# Patient Record
Sex: Female | Born: 1985
Health system: Southern US, Community
[De-identification: ages and names within clinical notes are randomized; demographics above are authoritative.]

## PROBLEM LIST (undated history)

## (undated) ENCOUNTER — Emergency Department (HOSPITAL_COMMUNITY): Admission: EM | Source: Home / Self Care

## (undated) DIAGNOSIS — F419 Anxiety disorder, unspecified: Secondary | ICD-10-CM

## (undated) DIAGNOSIS — F32A Depression, unspecified: Secondary | ICD-10-CM

## (undated) DIAGNOSIS — F329 Major depressive disorder, single episode, unspecified: Secondary | ICD-10-CM

## (undated) DIAGNOSIS — L4050 Arthropathic psoriasis, unspecified: Secondary | ICD-10-CM

## (undated) HISTORY — DX: Major depressive disorder, single episode, unspecified: F32.9

## (undated) HISTORY — DX: Anxiety disorder, unspecified: F41.9

## (undated) HISTORY — DX: Arthropathic psoriasis, unspecified: L40.50

## (undated) HISTORY — DX: Depression, unspecified: F32.A

---

## 2018-08-26 ENCOUNTER — Ambulatory Visit (INDEPENDENT_AMBULATORY_CARE_PROVIDER_SITE_OTHER): Payer: BLUE CROSS/BLUE SHIELD | Admitting: Urgent Care

## 2018-08-26 ENCOUNTER — Encounter: Payer: Self-pay | Admitting: Urgent Care

## 2018-08-26 VITALS — BP 118/70 | HR 75 | Temp 98.0°F | Resp 16 | Ht 61.0 in | Wt 152.2 lb

## 2018-08-26 DIAGNOSIS — Z13228 Encounter for screening for other metabolic disorders: Secondary | ICD-10-CM | POA: Diagnosis not present

## 2018-08-26 DIAGNOSIS — R21 Rash and other nonspecific skin eruption: Secondary | ICD-10-CM

## 2018-08-26 DIAGNOSIS — Z0000 Encounter for general adult medical examination without abnormal findings: Secondary | ICD-10-CM

## 2018-08-26 DIAGNOSIS — Z111 Encounter for screening for respiratory tuberculosis: Secondary | ICD-10-CM

## 2018-08-26 DIAGNOSIS — L4050 Arthropathic psoriasis, unspecified: Secondary | ICD-10-CM

## 2018-08-26 DIAGNOSIS — Z124 Encounter for screening for malignant neoplasm of cervix: Secondary | ICD-10-CM

## 2018-08-26 DIAGNOSIS — Z1329 Encounter for screening for other suspected endocrine disorder: Secondary | ICD-10-CM | POA: Diagnosis not present

## 2018-08-26 DIAGNOSIS — Z130 Encounter for screening for diseases of the blood and blood-forming organs and certain disorders involving the immune mechanism: Secondary | ICD-10-CM

## 2018-08-26 DIAGNOSIS — B354 Tinea corporis: Secondary | ICD-10-CM

## 2018-08-26 DIAGNOSIS — Z23 Encounter for immunization: Secondary | ICD-10-CM | POA: Diagnosis not present

## 2018-08-26 DIAGNOSIS — Z7189 Other specified counseling: Secondary | ICD-10-CM

## 2018-08-26 DIAGNOSIS — N761 Subacute and chronic vaginitis: Secondary | ICD-10-CM

## 2018-08-26 DIAGNOSIS — Z7185 Encounter for immunization safety counseling: Secondary | ICD-10-CM

## 2018-08-26 MED ORDER — FLUCONAZOLE 150 MG PO TABS: 150.0000 mg | ORAL_TABLET | ORAL | 0 refills | Status: DC

## 2018-08-26 MED ORDER — ALPRAZOLAM 0.5 MG PO TABS: 0.5000 mg | ORAL_TABLET | Freq: Every evening | ORAL | 0 refills | Status: DC | PRN

## 2018-08-26 MED ORDER — SULFASALAZINE 500 MG PO TABS: 500.0000 mg | ORAL_TABLET | Freq: Every day | ORAL | 3 refills | Status: DC

## 2018-08-26 MED ORDER — SERTRALINE HCL 50 MG PO TABS: 50.0000 mg | ORAL_TABLET | Freq: Every day | ORAL | 1 refills | Status: DC

## 2018-08-26 NOTE — Patient Instructions (Addendum)
[facility] [place], [place] [address]  Burnard Leigh [phone]  Horton Finer [phone]  Everardo Beals [phone]   [facility] Address: [address] [place], [place] [address]  [phone] I'll be starting in October 2019.    Health Maintenance, Female Adopting a healthy lifestyle and getting preventive care can go a long way to promote health and wellness. Talk with your health care provider about what schedule of regular examinations is right for you. This is a good chance for you to check in with your provider about disease prevention and staying healthy. In between checkups, there are plenty of things you can do on your own. Experts have done a lot of research about which lifestyle changes and preventive measures are most likely to keep you healthy. Ask your health care provider for more information. Weight and diet Eat a healthy diet  Be sure to include plenty of vegetables, fruits, low-fat dairy products, and lean protein.  Do not eat a lot of foods high in solid fats, added sugars, or salt.  Get regular exercise. This is one of the most important things you can do for your health. ? Most adults should exercise for at least 150 minutes each week. The exercise should increase your heart rate and make you sweat (moderate-intensity exercise). ? Most adults should also do strengthening exercises at least twice a week. This is in addition to the moderate-intensity exercise.  Maintain a healthy weight  Body mass index (BMI) is a measurement that can be used to identify possible weight problems. It estimates body fat based on height and weight. Your health care provider can help determine your BMI and help you achieve or maintain a healthy weight.  For females 81 years of age and older: ? A BMI below 18.5 is considered underweight. ? A BMI of 18.5 to 24.9 is normal. ? A BMI of 25 to 29.9 is  considered overweight. ? A BMI of 30 and above is considered obese.  Watch levels of cholesterol and blood lipids  You should start having your blood tested for lipids and cholesterol at 33 years of age, then have this test every 5 years.  You may need to have your cholesterol levels checked more often if: ? Your lipid or cholesterol levels are high. ? You are older than 33 years of age. ? You are at high risk for heart disease.  Cancer screening Lung Cancer  Lung cancer screening is recommended for adults 82-95 years old who are at high risk for lung cancer because of a history of smoking.  A yearly low-dose CT scan of the lungs is recommended for people who: ? Currently smoke. ? Have quit within the past 15 years. ? Have at least a 30-pack-year history of smoking. A pack year is smoking an average of one pack of cigarettes a day for 1 year.  Yearly screening should continue until it has been 15 years since you quit.  Yearly screening should stop if you develop a health problem that would prevent you from having lung cancer treatment.  Breast Cancer  Practice breast self-awareness. This means understanding how your breasts normally appear and feel.  It also means doing regular breast self-exams. Let your health care provider know about any changes, no matter how small.  If you are in your 20s or 30s, you should have a clinical breast exam (CBE) by a health care provider every 1-3 years as part of a regular  health exam.  If you are 40 or older, have a CBE every year. Also consider having a breast X-ray (mammogram) every year.  If you have a family history of breast cancer, talk to your health care provider about genetic screening.  If you are at high risk for breast cancer, talk to your health care provider about having an MRI and a mammogram every year.  Breast cancer gene (BRCA) assessment is recommended for women who have family members with BRCA-related cancers.  BRCA-related cancers include: ? Breast. ? Ovarian. ? Tubal. ? Peritoneal cancers.  Results of the assessment will determine the need for genetic counseling and BRCA1 and BRCA2 testing.  Cervical Cancer Your health care provider may recommend that you be screened regularly for cancer of the pelvic organs (ovaries, uterus, and vagina). This screening involves a pelvic examination, including checking for microscopic changes to the surface of your cervix (Pap test). You may be encouraged to have this screening done every 3 years, beginning at age 82.  For women ages 106-65, health care providers may recommend pelvic exams and Pap testing every 3 years, or they may recommend the Pap and pelvic exam, combined with testing for human papilloma virus (HPV), every 5 years. Some types of HPV increase your risk of cervical cancer. Testing for HPV may also be done on women of any age with unclear Pap test results.  Other health care providers may not recommend any screening for nonpregnant women who are considered low risk for pelvic cancer and who do not have symptoms. Ask your health care provider if a screening pelvic exam is right for you.  If you have had past treatment for cervical cancer or a condition that could lead to cancer, you need Pap tests and screening for cancer for at least 20 years after your treatment. If Pap tests have been discontinued, your risk factors (such as having a new sexual partner) need to be reassessed to determine if screening should resume. Some women have medical problems that increase the chance of getting cervical cancer. In these cases, your health care provider may recommend more frequent screening and Pap tests.  Colorectal Cancer  This type of cancer can be detected and often prevented.  Routine colorectal cancer screening usually begins at 33 years of age and continues through 33 years of age.  Your health care provider may recommend screening at an earlier age if  you have risk factors for colon cancer.  Your health care provider may also recommend using home test kits to check for hidden blood in the stool.  A small camera at the end of a tube can be used to examine your colon directly (sigmoidoscopy or colonoscopy). This is done to check for the earliest forms of colorectal cancer.  Routine screening usually begins at age 8.  Direct examination of the colon should be repeated every 5-10 years through 33 years of age. However, you may need to be screened more often if early forms of precancerous polyps or small growths are found.  Skin Cancer  Check your skin from head to toe regularly.  Tell your health care provider about any new moles or changes in moles, especially if there is a change in a mole's shape or color.  Also tell your health care provider if you have a mole that is larger than the size of a pencil eraser.  Always use sunscreen. Apply sunscreen liberally and repeatedly throughout the day.  Protect yourself by wearing long sleeves, pants, a  wide-brimmed hat, and sunglasses whenever you are outside.  Heart disease, diabetes, and high blood pressure  High blood pressure causes heart disease and increases the risk of stroke. High blood pressure is more likely to develop in: ? People who have blood pressure in the high end of the normal range (130-139/85-89 mm Hg). ? People who are overweight or obese. ? People who are African American.  If you are 59-106 years of age, have your blood pressure checked every 3-5 years. If you are 59 years of age or older, have your blood pressure checked every year. You should have your blood pressure measured twice-once when you are at a hospital or clinic, and once when you are not at a hospital or clinic. Record the average of the two measurements. To check your blood pressure when you are not at a hospital or clinic, you can use: ? An automated blood pressure machine at a pharmacy. ? A home blood  pressure monitor.  If you are between 67 years and 70 years old, ask your health care provider if you should take aspirin to prevent strokes.  Have regular diabetes screenings. This involves taking a blood sample to check your fasting blood sugar level. ? If you are at a normal weight and have a low risk for diabetes, have this test once every three years after 33 years of age. ? If you are overweight and have a high risk for diabetes, consider being tested at a younger age or more often. Preventing infection Hepatitis B  If you have a higher risk for hepatitis B, you should be screened for this virus. You are considered at high risk for hepatitis B if: ? You were born in a country where hepatitis B is common. Ask your health care provider which countries are considered high risk. ? Your parents were born in a high-risk country, and you have not been immunized against hepatitis B (hepatitis B vaccine). ? You have HIV or AIDS. ? You use needles to inject street drugs. ? You live with someone who has hepatitis B. ? You have had sex with someone who has hepatitis B. ? You get hemodialysis treatment. ? You take certain medicines for conditions, including cancer, organ transplantation, and autoimmune conditions.  Hepatitis C  Blood testing is recommended for: ? Everyone born from [birth year] through 1966. ? Anyone with known risk factors for hepatitis C.  Sexually transmitted infections (STIs)  You should be screened for sexually transmitted infections (STIs) including gonorrhea and chlamydia if: ? You are sexually active and are younger than 33 years of age. ? You are older than 33 years of age and your health care provider tells you that you are at risk for this type of infection. ? Your sexual activity has changed since you were last screened and you are at an increased risk for chlamydia or gonorrhea. Ask your health care provider if you are at risk.  If you do not have HIV, but are at risk,  it may be recommended that you take a prescription medicine daily to prevent HIV infection. This is called pre-exposure prophylaxis (PrEP). You are considered at risk if: ? You are sexually active and do not regularly use condoms or know the HIV status of your partner(s). ? You take drugs by injection. ? You are sexually active with a partner who has HIV.  Talk with your health care provider about whether you are at high risk of being infected with HIV. If you choose to  begin PrEP, you should first be tested for HIV. You should then be tested every 3 months for as long as you are taking PrEP. Pregnancy  If you are premenopausal and you may become pregnant, ask your health care provider about preconception counseling.  If you may become pregnant, take 400 to 800 micrograms (mcg) of folic acid every day.  If you want to prevent pregnancy, talk to your health care provider about birth control (contraception). Osteoporosis and menopause  Osteoporosis is a disease in which the bones lose minerals and strength with aging. This can result in serious bone fractures. Your risk for osteoporosis can be identified using a bone density scan.  If you are 31 years of age or older, or if you are at risk for osteoporosis and fractures, ask your health care provider if you should be screened.  Ask your health care provider whether you should take a calcium or vitamin D supplement to lower your risk for osteoporosis.  Menopause may have certain physical symptoms and risks.  Hormone replacement therapy may reduce some of these symptoms and risks. Talk to your health care provider about whether hormone replacement therapy is right for you. Follow these instructions at home:  Schedule regular health, dental, and eye exams.  Stay current with your immunizations.  Do not use any tobacco products including cigarettes, chewing tobacco, or electronic cigarettes.  If you are pregnant, do not drink  alcohol.  If you are breastfeeding, limit how much and how often you drink alcohol.  Limit alcohol intake to no more than 1 drink per day for nonpregnant women. One drink equals 12 ounces of beer, 5 ounces of wine, or 1 ounces of hard liquor.  Do not use street drugs.  Do not share needles.  Ask your health care provider for help if you need support or information about quitting drugs.  Tell your health care provider if you often feel depressed.  Tell your health care provider if you have ever been abused or do not feel safe at home. This information is not intended to replace advice given to you by your health care provider. Make sure you discuss any questions you have with your health care provider. Document Released: 06/15/2011 Document Revised: 05/07/2016 Document Reviewed: 09/03/2015 Elsevier Interactive Patient Education  2018 [employer].    Tdap Vaccine (Tetanus, Diphtheria and Pertussis): What You Need to Know 1. Why get vaccinated? Tetanus, diphtheria and pertussis are very serious diseases. Tdap vaccine can protect [place] from these diseases. And, Tdap vaccine given to pregnant women can protect newborn babies against pertussis. TETANUS (Lockjaw) is rare in the [place] today. It causes painful muscle tightening and stiffness, usually all over the body.  It can lead to tightening of muscles in the head and neck so you can't open your mouth, swallow, or sometimes even breathe. Tetanus kills about 1 out of 10 people who are infected even after receiving the best medical care.  DIPHTHERIA is also rare in the [place] today. It can cause a thick coating to form in the back of the throat.  It can lead to breathing problems, heart failure, paralysis, and death.  PERTUSSIS (Whooping Cough) causes severe coughing spells, which can cause difficulty breathing, vomiting and disturbed sleep.  It can also lead to weight loss, incontinence, and rib fractures. Up to 2 in 100  adolescents and 5 in 100 adults with pertussis are hospitalized or have complications, which could include pneumonia or death.  These diseases are  caused by bacteria. Diphtheria and pertussis are spread from person to person through secretions from coughing or sneezing. Tetanus enters the body through cuts, scratches, or wounds. Before vaccines, as many as 200,000 cases of diphtheria, 200,000 cases of pertussis, and hundreds of cases of tetanus, were reported in the [place] each year. Since vaccination began, reports of cases for tetanus and diphtheria have dropped by about 99% and for pertussis by about 80%. 2. Tdap vaccine Tdap vaccine can protect adolescents and adults from tetanus, diphtheria, and pertussis. One dose of Tdap is routinely given at age 55 or 29. People who did not get Tdap at that age should get it as soon as possible. Tdap is especially important for healthcare professionals and anyone having close contact with a baby younger than 12 months. Pregnant women should get a dose of Tdap during every pregnancy, to protect the newborn from pertussis. Infants are most at risk for severe, life-threatening complications from pertussis. Another vaccine, called Td, protects against tetanus and diphtheria, but not pertussis. A Td booster should be given every 10 years. Tdap may be given as one of these boosters if you have never gotten Tdap before. Tdap may also be given after a severe cut or burn to prevent tetanus infection. Your doctor or the person giving you the vaccine can give you more information. Tdap may safely be given at the same time as other vaccines. 3. Some people should not get this vaccine  A person who has ever had a life-threatening allergic reaction after a previous dose of any diphtheria, tetanus or pertussis containing vaccine, OR has a severe allergy to any part of this vaccine, should not get Tdap vaccine. Tell the person giving the vaccine about any severe  allergies.  Anyone who had coma or long repeated seizures within 7 days after a childhood dose of DTP or DTaP, or a previous dose of Tdap, should not get Tdap, unless a cause other than the vaccine was found. They can still get Td.  Talk to your doctor if you: ? have seizures or another nervous system problem, ? had severe pain or swelling after any vaccine containing diphtheria, tetanus or pertussis, ? ever had a condition called Guillain-Barr Syndrome (GBS), ? aren't feeling well on the day the shot is scheduled. 4. Risks With any medicine, including vaccines, there is a chance of side effects. These are usually mild and go away on their own. Serious reactions are also possible but are rare. Most people who get Tdap vaccine do not have any problems with it. Mild problems following Tdap: (Did not interfere with activities)  Pain where the shot was given (about 3 in 4 adolescents or 2 in 3 adults)  Redness or swelling where the shot was given (about 1 person in 5)  Mild fever of at least 100.87F (up to about 1 in 25 adolescents or 1 in 100 adults)  Headache (about 3 or 4 people in 10)  Tiredness (about 1 person in 3 or 4)  Nausea, vomiting, diarrhea, stomach ache (up to 1 in 4 adolescents or 1 in 10 adults)  Chills, sore joints (about 1 person in 10)  Body aches (about 1 person in 3 or 4)  Rash, swollen glands (uncommon)  Moderate problems following Tdap: (Interfered with activities, but did not require medical attention)  Pain where the shot was given (up to 1 in 5 or 6)  Redness or swelling where the shot was given (up to about 1 in 16  adolescents or 1 in 12 adults)  Fever over 102F (about 1 in 100 adolescents or 1 in 250 adults)  Headache (about 1 in 7 adolescents or 1 in 10 adults)  Nausea, vomiting, diarrhea, stomach ache (up to 1 or 3 people in 100)  Swelling of the entire arm where the shot was given (up to about 1 in 500).  Severe problems following  Tdap: (Unable to perform usual activities; required medical attention)  Swelling, severe pain, bleeding and redness in the arm where the shot was given (rare).  Problems that could happen after any vaccine:  People sometimes faint after a medical procedure, including vaccination. Sitting or lying down for about 15 minutes can help prevent fainting, and injuries caused by a fall. Tell your doctor if you feel dizzy, or have vision changes or ringing in the ears.  Some people get severe pain in the shoulder and have difficulty moving the arm where a shot was given. This happens very rarely.  Any medication can cause a severe allergic reaction. Such reactions from a vaccine are very rare, estimated at fewer than 1 in a million doses, and would happen within a few minutes to a few hours after the vaccination. As with any medicine, there is a very remote chance of a vaccine causing a serious injury or death. The safety of vaccines is always being monitored. For more information, visit: http://www.aguilar.org/ 5. What if there is a serious problem? What should I look for? Look for anything that concerns you, such as signs of a severe allergic reaction, very high fever, or unusual behavior. Signs of a severe allergic reaction can include hives, swelling of the face and throat, difficulty breathing, a fast heartbeat, dizziness, and weakness. These would usually start a few minutes to a few hours after the vaccination. What should I do?  If you think it is a severe allergic reaction or other emergency that can't wait, call 9-1-1 or get the person to the nearest hospital. Otherwise, call your doctor.  Afterward, the reaction should be reported to the Vaccine Adverse Event Reporting System (VAERS). Your doctor might file this report, or you can do it yourself through the VAERS web site at www.vaers.SamedayNews.es, or by calling 365-301-8146. ? VAERS does not give medical advice. 6. The National Vaccine  Injury Compensation Program The [employer] Vaccine Injury Compensation Program (VICP) is a federal program that was created to compensate people who may have been injured by certain vaccines. Persons who believe they may have been injured by a vaccine can learn about the program and about filing a claim by calling 418-235-6505 or visiting the [employer] website at GoldCloset.com.ee. There is a time limit to file a claim for compensation. 7. How can I learn more?  Ask your doctor. He or she can give you the vaccine package insert or suggest other sources of information.  Call your local or state health department.  Contact the Centers for Disease Control and Prevention (CDC): ? Call 779-202-8623 (1-800-CDC-INFO) or ? Visit CDC's website at http://hunter.com/ CDC Tdap Vaccine VIS (02/06/14) This information is not intended to replace advice given to you by your health care provider. Make sure you discuss any questions you have with your health care provider. Document Released: 05/31/2012 Document Revised: 08/20/2016 Document Reviewed: 08/20/2016 Elsevier Interactive Patient Education  2017 [employer].  If you have lab work done today you will be contacted with your lab results within the next 2 weeks.  If you have not heard from [place]  then please contact us. The fastest way to get your results is to register for My Chart.    Sertraline tablets What is this medicine? SERTRALINE (SER tra leen) is used to treat depression. It may also be used to treat obsessive compulsive disorder, panic disorder, post-trauma stress, premenstrual dysphoric disorder (PMDD) or social anxiety. This medicine may be used for other purposes; ask your health care provider or pharmacist if you have questions. COMMON BRAND NAME(S): Zoloft What should I tell my health care provider before I take this medicine? They need to know if you have any of these conditions: -bleeding disorders -bipolar disorder or a  family history of bipolar disorder -glaucoma -heart disease -high blood pressure -history of irregular heartbeat -history of low levels of calcium, magnesium, or potassium in the blood -if you often drink alcohol -liver disease -receiving electroconvulsive therapy -seizures -suicidal thoughts, plans, or attempt; a previous suicide attempt by you or a family member -take medicines that treat or prevent blood clots -thyroid disease -an unusual or allergic reaction to sertraline, other medicines, foods, dyes, or preservatives -pregnant or trying to get pregnant -breast-feeding How should I use this medicine? Take this medicine by mouth with a glass of water. Follow the directions on the prescription label. You can take it with or without food. Take your medicine at regular intervals. Do not take your medicine more often than directed. Do not stop taking this medicine suddenly except upon the advice of your doctor. Stopping this medicine too quickly may cause serious side effects or your condition may worsen. A special MedGuide will be given to you by the pharmacist with each prescription and refill. Be sure to read this information carefully each time. Talk to your pediatrician regarding the use of this medicine in children. While this drug may be prescribed for children as young as 7 years for selected conditions, precautions do apply. Overdosage: If you think you have taken too much of this medicine contact a poison control center or emergency room at once. NOTE: This medicine is only for you. Do not share this medicine with others. What if I miss a dose? If you miss a dose, take it as soon as you can. If it is almost time for your next dose, take only that dose. Do not take double or extra doses. What may interact with this medicine? Do not take this medicine with any of the following medications: -cisapride -dofetilide -dronedarone -linezolid -MAOIs like Carbex, Eldepryl, Marplan,  Nardil, and Parnate -methylene blue (injected into a vein) -pimozide -thioridazine This medicine may also interact with the following medications: -alcohol -amphetamines -aspirin and aspirin-like medicines -certain medicines for depression, anxiety, or psychotic disturbances -certain medicines for fungal infections like ketoconazole, fluconazole, posaconazole, and itraconazole -certain medicines for irregular heart beat like flecainide, quinidine, propafenone -certain medicines for migraine headaches like almotriptan, eletriptan, frovatriptan, naratriptan, rizatriptan, sumatriptan, zolmitriptan -certain medicines for sleep -certain medicines for seizures like carbamazepine, valproic acid, phenytoin -certain medicines that treat or prevent blood clots like warfarin, enoxaparin, dalteparin -cimetidine -digoxin -diuretics -fentanyl -isoniazid -lithium -NSAIDs, medicines for pain and inflammation, like ibuprofen or naproxen -other medicines that prolong the QT interval (cause an abnormal heart rhythm) -rasagiline -safinamide -supplements like St. John's wort, kava kava, valerian -tolbutamide -tramadol -tryptophan This list may not describe all possible interactions. Give your health care provider a list of all the medicines, herbs, non-prescription drugs, or dietary supplements you use. Also tell them if you smoke, drink alcohol, or use illegal drugs. Some items  may interact with your medicine. What should I watch for while using this medicine? Tell your doctor if your symptoms do not get better or if they get worse. Visit your doctor or health care professional for regular checks on your progress. Because it may take several weeks to see the full effects of this medicine, it is important to continue your treatment as prescribed by your doctor. Patients and their families should watch out for new or worsening thoughts of suicide or depression. Also watch out for sudden changes in feelings  such as feeling anxious, agitated, panicky, irritable, hostile, aggressive, impulsive, severely restless, overly excited and hyperactive, or not being able to sleep. If this happens, especially at the beginning of treatment or after a change in dose, call your health care professional. Dennis Bast may get drowsy or dizzy. Do not drive, use machinery, or do anything that needs mental alertness until you know how this medicine affects you. Do not stand or sit up quickly, especially if you are an older patient. This reduces the risk of dizzy or fainting spells. Alcohol may interfere with the effect of this medicine. Avoid alcoholic drinks. Your mouth may get dry. Chewing sugarless gum or sucking hard candy, and drinking plenty of water may help. Contact your doctor if the problem does not go away or is severe. What side effects may I notice from receiving this medicine? Side effects that you should report to your doctor or health care professional as soon as possible: -allergic reactions like skin rash, itching or hives, swelling of the face, lips, or tongue -anxious -black, tarry stools -changes in vision -confusion -elevated mood, decreased need for sleep, racing thoughts, impulsive behavior -eye pain -fast, irregular heartbeat -feeling faint or lightheaded, falls -feeling agitated, angry, or irritable -hallucination, loss of contact with reality -loss of balance or coordination -loss of memory -painful or prolonged erections -restlessness, pacing, inability to keep still -seizures -stiff muscles -suicidal thoughts or other mood changes -trouble sleeping -unusual bleeding or bruising -unusually weak or tired -vomiting Side effects that usually do not require medical attention (report to your doctor or health care professional if they continue or are bothersome): -change in appetite or weight -change in sex drive or performance -diarrhea -increased sweating -indigestion, nausea -tremors This  list may not describe all possible side effects. Call your doctor for medical advice about side effects. You may report side effects to FDA at 1-800-FDA-1088. Where should I keep my medicine? Keep out of the reach of children. Store at room temperature between 15 and 30 degrees C (59 and 86 degrees F). Throw away any unused medicine after the expiration date. NOTE: This sheet is a summary. It may not cover all possible information. If you have questions about this medicine, talk to your doctor, pharmacist, or health care provider.  2018 Elsevier/Gold Standard (2016-12-04 14:17:49)    Alprazolam tablets What is this medicine? ALPRAZOLAM (al PRAY zoe lam) is a benzodiazepine. It is used to treat anxiety and panic attacks. This medicine may be used for other purposes; ask your health care provider or pharmacist if you have questions. COMMON BRAND NAME(S): Xanax What should I tell my health care provider before I take this medicine? They need to know if you have any of these conditions: -an alcohol or drug abuse problem -bipolar disorder, depression, psychosis or other mental health conditions -glaucoma -kidney or liver disease -lung or breathing disease -myasthenia gravis -Parkinson's disease -porphyria -seizures or a history of seizures -suicidal thoughts -an unusual or  allergic reaction to alprazolam, other benzodiazepines, foods, dyes, or preservatives -pregnant or trying to get pregnant -breast-feeding How should I use this medicine? Take this medicine by mouth with a glass of water. Follow the directions on the prescription label. Take your medicine at regular intervals. Do not take it more often than directed. Do not stop taking except on your doctor's advice. A special MedGuide will be given to you by the pharmacist with each prescription and refill. Be sure to read this information carefully each time. Talk to your pediatrician regarding the use of this medicine in children.  Special care may be needed. Overdosage: If you think you have taken too much of this medicine contact a poison control center or emergency room at once. NOTE: This medicine is only for you. Do not share this medicine with others. What if I miss a dose? If you miss a dose, take it as soon as you can. If it is almost time for your next dose, take only that dose. Do not take double or extra doses. What may interact with this medicine? Do not take this medicine with any of the following medications: -certain antiviral medicines for HIV or AIDS like delavirdine, indinavir -certain medicines for fungal infections like ketoconazole and itraconazole -narcotic medicines for cough -sodium oxybate This medicine may also interact with the following medications: -alcohol -antihistamines for allergy, cough and cold -certain antibiotics like clarithromycin, erythromycin, isoniazid, rifampin, rifapentine, rifabutin, and troleandomycin -certain medicines for blood pressure, heart disease, irregular heart beat -certain medicines for depression, like amitriptyline, fluoxetine, sertraline -certain medicines for seizures like carbamazepine, oxcarbazepine, phenobarbital, phenytoin, primidone -cimetidine -cyclosporine -female hormones, like estrogens or progestins and birth control pills, patches, rings, or injections -general anesthetics like halothane, isoflurane, methoxyflurane, propofol -grapefruit juice -local anesthetics like lidocaine, pramoxine, tetracaine -medicines that relax muscles for surgery -narcotic medicines for pain -other antiviral medicines for HIV or AIDS -phenothiazines like chlorpromazine, mesoridazine, prochlorperazine, thioridazine This list may not describe all possible interactions. Give your health care provider a list of all the medicines, herbs, non-prescription drugs, or dietary supplements you use. Also tell them if you smoke, drink alcohol, or use illegal drugs. Some items may  interact with your medicine. What should I watch for while using this medicine? Tell your doctor or health care professional if your symptoms do not start to get better or if they get worse. Do not stop taking except on your doctor's advice. You may develop a severe reaction. Your doctor will tell you how much medicine to take. You may get drowsy or dizzy. Do not drive, use machinery, or do anything that needs mental alertness until you know how this medicine affects you. To reduce the risk of dizzy and fainting spells, do not stand or sit up quickly, especially if you are an older patient. Alcohol may increase dizziness and drowsiness. Avoid alcoholic drinks. If you are taking another medicine that also causes drowsiness, you may have more side effects. Give your health care provider a list of all medicines you use. Your doctor will tell you how much medicine to take. Do not take more medicine than directed. Call emergency for help if you have problems breathing or unusual sleepiness. What side effects may I notice from receiving this medicine? Side effects that you should report to your doctor or health care professional as soon as possible: -allergic reactions like skin rash, itching or hives, swelling of the face, lips, or tongue -breathing problems -confusion -loss of balance or coordination -signs and  symptoms of low blood pressure like dizziness; feeling faint or lightheaded, falls; unusually weak or tired -suicidal thoughts or other mood changes Side effects that usually do not require medical attention (report to your doctor or health care professional if they continue or are bothersome): -dizziness -dry mouth -nausea, vomiting -tiredness This list may not describe all possible side effects. Call your doctor for medical advice about side effects. You may report side effects to FDA at 1-800-FDA-1088. Where should I keep my medicine? Keep out of the reach of children. This medicine can be  abused. Keep your medicine in a safe place to protect it from theft. Do not share this medicine with anyone. Selling or giving away this medicine is dangerous and against the law. Store at room temperature between 20 and 25 degrees C (68 and 77 degrees F). This medicine may cause accidental overdose and death if taken by other adults, children, or pets. Mix any unused medicine with a substance like cat litter or coffee grounds. Then throw the medicine away in a sealed container like a sealed bag or a coffee can with a lid. Do not use the medicine after the expiration date. NOTE: This sheet is a summary. It may not cover all possible information. If you have questions about this medicine, talk to your doctor, pharmacist, or health care provider.  2018 Elsevier/Gold Standard (2015-08-29 13:47:25)     IF you received an x-ray today, you will receive an invoice from [facility]. Please contact [facility] at [phone] with questions or concerns regarding your invoice.   IF you received labwork today, you will receive an invoice from [place]. Please contact [facility] at [phone] with questions or concerns regarding your invoice.   Our billing staff will not be able to assist you with questions regarding bills from these companies.  You will be contacted with the lab results as soon as they are available. The fastest way to get your results is to activate your My Chart account. Instructions are located on the last page of this paperwork. If you have not heard from [place] regarding the results in 2 weeks, please contact this office.

## 2018-08-26 NOTE — Progress Notes (Signed)

## 2018-08-27 LAB — MEASLES/MUMPS/RUBELLA IMMUNITY
MUMPS ABS, IGG: 61.3 [AU]/ml (ref >10.9)
RUBELLA: 1.52 {index} (ref >0.99)

## 2018-08-27 LAB — VARICELLA ZOSTER ANTIBODY, IGG: VARICELLA: 2040 {index} (ref >165)

## 2018-08-27 LAB — HEPATITIS B SURFACE ANTIBODY, QUANTITATIVE: HEPATITIS B SURF AB QUANT: 3.7 m[IU]/mL — AB

## 2018-08-29 ENCOUNTER — Ambulatory Visit (INDEPENDENT_AMBULATORY_CARE_PROVIDER_SITE_OTHER): Payer: BLUE CROSS/BLUE SHIELD | Admitting: Urgent Care

## 2018-08-29 ENCOUNTER — Telehealth: Payer: Self-pay | Admitting: Obstetrics and Gynecology

## 2018-08-29 ENCOUNTER — Other Ambulatory Visit: Payer: Self-pay | Admitting: Urgent Care

## 2018-08-29 ENCOUNTER — Encounter: Payer: Self-pay | Admitting: Urgent Care

## 2018-08-29 DIAGNOSIS — Z23 Encounter for immunization: Secondary | ICD-10-CM | POA: Diagnosis not present

## 2018-08-29 NOTE — Progress Notes (Signed)
Patient is updating her hepatitis B immunity with a Booster series. Please let patient know that she should come back for a 2nd dose in 1-2 months, 3rd dose in 1 year.

## 2018-08-29 NOTE — Telephone Encounter (Signed)
Called and left a message for patient to call back to schedule a new patient doctor referral appointment with our office to establish care and for chronic vaginitis.

## 2018-08-29 NOTE — Progress Notes (Signed)
MRN: [id number]  Subjective:   [name] is a 32 y.o. female presenting for annual physical exam.  Patient is originally from [place], moved to [place] with her husband for his work.  [place].  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  There is a bit of strain within her marriage and not having a support network here.  Patient is not currently seeing a therapist, has gone to marriage counseling with her husband in the past but are not considering this currently.  She has a history of anxiety and depression and is currently managing this with sertraline at 25 mg and Wellbutrin 300 mg once daily.  She also takes sulfasalazine for psoriatic arthritis and needs a refill of this medication today.  Patient has had extensive work-up for her psoriatic arthritis, has a rheumatologist and is supposed to set up an office visit when she has an active flare for continued work-up.  Patient also reports a history of chronic vaginitis that she has managed with Monistat.  Her vaginitis is characterized by itching and vaginal irritation very closely associated with her cycle.  Admits that the last episode was very difficult to resolve but she eventually achieved this by not using Monistat.  She denies any current symptoms with this.  reports that she has never smoked. She has never used smokeless tobacco.   Medical care team includes: PCP: Wallis BambergMario Tamsen Reist, PA-C OB/GYN: Is due for Pap smear. Specialists: Rheumatology. Health Maintenance: Titers pending for immunizations.   Rebecca Gould is currently taking sulfasalazine, sertraline and Wellbutrin.  She has No Known Allergies.  Rebecca Gould  has a past medical history of Anxiety, Depression, and Psoriatic arthritis (HCC). Denies past surgical history. Denies family history of cancer, diabetes, HTN, HL, heart disease, stroke, mental illness.   Immunizations: Is  from [place], does not have her immunization record.  Review of Systems  Constitutional: Negative for chills, diaphoresis, fever, malaise/fatigue and weight loss.  HENT: Negative for congestion, ear discharge, ear pain, hearing loss, nosebleeds, sore throat and tinnitus.   Eyes: Negative for blurred vision, double vision, photophobia, pain, discharge and redness.  Respiratory: Negative for cough, shortness of breath and wheezing.   Cardiovascular: Negative for chest pain, palpitations and leg swelling.  Gastrointestinal: Negative for abdominal pain, blood in stool, constipation, diarrhea, nausea and vomiting.  Genitourinary: Negative for dysuria, flank pain, frequency, hematuria and urgency.  Musculoskeletal: Negative for back pain, joint pain and myalgias.  Skin: Positive for rash. Negative for itching.  Neurological: Negative for dizziness, tingling, seizures, loss of consciousness, weakness and headaches.  Endo/Heme/Allergies: Negative for polydipsia.  Psychiatric/Behavioral: Negative for depression, hallucinations, memory loss, substance abuse and suicidal ideas. The patient is not nervous/anxious and does not have insomnia.    Objective:   Vitals: BP 118/70   Pulse 75   Temp 98 F (36.7 C) (Oral)   Resp 16   Ht 5\' 1"  (1.549 m)   Wt 152 lb 3.2 oz (69 kg)   SpO2 97%   BMI 28.76 kg/m   Physical Exam  Constitutional: She is oriented to person, place, and time. She appears well-developed and well-nourished.  HENT:  TM's intact bilaterally, no effusions or erythema. Nasal turbinates pink and moist, nasal passages patent. No sinus tenderness. Oropharynx clear, mucous membranes moist, dentition in good repair.  Eyes: Pupils are equal, round, and reactive to light. Conjunctivae  and EOM are normal. Right eye exhibits no discharge. Left eye exhibits no discharge. No scleral icterus.  Neck: Normal range of motion. Neck supple. No thyromegaly present.  Cardiovascular: Normal rate,  regular rhythm, normal heart sounds and intact distal pulses. Exam reveals no gallop and no friction rub.  No murmur heard. Pulmonary/Chest: Effort normal and breath sounds normal. No respiratory distress. She has no wheezes. She has no rales.  Abdominal: Soft. Bowel sounds are normal. She exhibits no distension and no mass. There is no tenderness.  Musculoskeletal: Normal range of motion. She exhibits no edema or tenderness.  Lymphadenopathy:    She has no cervical adenopathy.  Neurological: She is alert and oriented to person, place, and time. She has normal reflexes. She displays normal reflexes. Coordination normal.  Skin: Skin is warm and dry. Rash noted. No erythema. No pallor.  A total of 3 annular lesions with central clearing over axillary areas bilaterally varying in size between 2 and 3 cm at the widest diameter.  Psychiatric: She has a normal mood and affect.   Assessment and Plan :   Annual physical exam - Plan: CBC, Comprehensive metabolic panel, HIV Antibody (routine testing w rflx), Lipid panel, TSH  Screening for deficiency anemia  Screening for thyroid disorder  Screening for metabolic disorder  Screening for tuberculosis - Plan: QuantiFERON-TB Gold Plus  Immunization counseling - Plan: Measles/Mumps/Rubella Immunity, Hepatitis B surface antibody,quantitative, Varicella zoster antibody, IgG  Need for Tdap vaccination  Psoriatic arthritis (HCC)  Encounter for screening for cervical cancer  - Plan: Ambulatory referral to Gynecology  Rash and nonspecific skin eruption  Tinea corporis  Chronic vaginitis - Plan: Ambulatory referral to Gynecology  Patient needs better management of her medications with anxiety and depression.  We discussed this at length including increasing her sertraline and potentially consider having her come off Wellbutrin in the future.  We will get started with this today.  Titration instructions provided to patient of getting 200 mg once  daily for sertraline.  I refilled her sulfasalazine as well.  Labs pending, patient is stable.  She is to start Diflucan to address tinea corporis.  I counseled that this may address her chronic vaginitis.  Referral to gynecology pending both of her Pap smear work-up of these particular symptoms. Discussed healthy lifestyle, diet, exercise, preventative care, vaccinations, and addressed patient's concerns.     Wallis Bamberg, PA-C [facility] [phone] 08/29/2018  7:55 AM

## 2018-08-30 ENCOUNTER — Telehealth: Payer: Self-pay | Admitting: Urgent Care

## 2018-08-30 NOTE — Telephone Encounter (Signed)
Copied from CRM [phone]. Topic: Quick Communication - Lab Results >> Aug 30, 2018  1:11 PM Crist InfanteHarrald, Kathy J wrote: Pt calling back for the QuantiFERON results. Pt would also like her physical form for [employer] faxed to Fax  [phone] (when these results come back)  Pt will pick up her original when ready

## 2018-08-31 NOTE — Telephone Encounter (Signed)
Called and left a message for patient to call back to schedule a new patient doctor referral appointment with our office to establish care and for chronic vaginitis. °

## 2018-09-01 LAB — LIPID PANEL
CHOL/HDL RATIO: 4.8 ratio — AB (ref 0.0–4.4)
Cholesterol, Total: 231 mg/dL — ABNORMAL HIGH (ref 100–199)
HDL: 48 mg/dL (ref >39)
LDL CALC: 153 mg/dL — AB (ref 0–99)
Triglycerides: 150 mg/dL — ABNORMAL HIGH (ref 0–149)
VLDL Cholesterol Cal: 30 mg/dL (ref 5–40)

## 2018-09-01 LAB — QUANTIFERON-TB GOLD PLUS
QUANTIFERON TB1 AG VALUE: 0.13 [IU]/mL
QuantiFERON Mitogen Value: >10 IU/mL
QuantiFERON Nil Value: 0.11 IU/mL
QuantiFERON TB2 Ag Value: 0.12 IU/mL
QuantiFERON-TB Gold Plus: NEGATIVE

## 2018-09-01 LAB — COMPREHENSIVE METABOLIC PANEL
A/G RATIO: 2 (ref 1.2–2.2)
ALBUMIN: 4.7 g/dL (ref 3.5–5.5)
ALT: 11 IU/L (ref 0–32)
AST: 16 IU/L (ref 0–40)
Alkaline Phosphatase: 65 IU/L (ref 39–117)
BUN / CREAT RATIO: 12 (ref 9–23)
BUN: 12 mg/dL (ref 6–20)
Bilirubin Total: 0.5 mg/dL (ref 0.0–1.2)
CALCIUM: 9.4 mg/dL (ref 8.7–10.2)
CO2: 20 mmol/L (ref 20–29)
Chloride: 103 mmol/L (ref 96–106)
Creatinine, Ser: 1.02 mg/dL — ABNORMAL HIGH (ref 0.57–1.00)
GFR, EST AFRICAN AMERICAN: 84 mL/min/{1.73_m2} (ref >59)
GFR, EST NON AFRICAN AMERICAN: 72 mL/min/{1.73_m2} (ref >59)
GLOBULIN, TOTAL: 2.3 g/dL (ref 1.5–4.5)
GLUCOSE: 92 mg/dL (ref 65–99)
POTASSIUM: 4 mmol/L (ref 3.5–5.2)
SODIUM: 139 mmol/L (ref 134–144)
TOTAL PROTEIN: 7 g/dL (ref 6.0–8.5)

## 2018-09-01 LAB — CBC
Hematocrit: 40.2 % (ref 34.0–46.6)
Hemoglobin: 13.4 g/dL (ref 11.1–15.9)
MCH: 29.9 pg (ref 26.6–33.0)
MCHC: 33.3 g/dL (ref 31.5–35.7)
MCV: 90 fL (ref 79–97)
PLATELETS: 300 10*3/uL (ref 150–450)
RBC: 4.48 x10E6/uL (ref 3.77–5.28)
RDW: 13.8 % (ref 12.3–15.4)
WBC: 6.3 10*3/uL (ref 3.4–10.8)

## 2018-09-01 LAB — HIV ANTIBODY (ROUTINE TESTING W REFLEX): HIV Screen 4th Generation wRfx: NONREACTIVE

## 2018-09-01 LAB — TSH: TSH: 1.44 u[IU]/mL (ref 0.450–4.500)

## 2018-09-15 ENCOUNTER — Ambulatory Visit: Payer: BLUE CROSS/BLUE SHIELD | Admitting: Certified Nurse Midwife

## 2018-09-21 ENCOUNTER — Telehealth: Payer: Self-pay | Admitting: Certified Nurse Midwife

## 2018-09-21 NOTE — Telephone Encounter (Signed)
Called and left a message for patient to call back to schedule a new patient doctor referral appointment with any provider to establish care and for chronic vaginitis. °

## 2018-09-23 NOTE — Telephone Encounter (Signed)
Called and left a message for patient to call back to schedule a new patient doctor referral appointment with any provider to establish care and for chronic vaginitis.

## 2018-09-26 NOTE — Telephone Encounter (Signed)
Called and left a message for patient to call back to schedule a new patient doctor referral appointment with any provider to establish care and for chronic vaginitis. °

## 2018-09-28 NOTE — Telephone Encounter (Signed)
Routing referral back to referring office. Patient has not returned multiple calls to schedule an appointment with our office. °

## 2018-11-04 ENCOUNTER — Telehealth: Payer: Self-pay

## 2018-11-04 NOTE — Telephone Encounter (Signed)
Health Exam Certificate for [employer] completed with MMR titer and Hep B and TB results noted.  Faxed to [employer]  [phone] Copy for scan to chart Original mailed to pt today 

## 2018-12-04 ENCOUNTER — Other Ambulatory Visit: Payer: Self-pay | Admitting: Urgent Care

## 2019-01-17 ENCOUNTER — Ambulatory Visit (HOSPITAL_COMMUNITY)
Admission: EM | Admit: 2019-01-17 | Discharge: 2019-01-17 | Disposition: A | Discharge status: Home / Self Care | Payer: Self-pay | Attending: Family Medicine | Admitting: Family Medicine

## 2019-01-17 ENCOUNTER — Encounter (HOSPITAL_COMMUNITY): Payer: Self-pay | Admitting: Emergency Medicine

## 2019-01-17 ENCOUNTER — Ambulatory Visit (INDEPENDENT_AMBULATORY_CARE_PROVIDER_SITE_OTHER): Payer: Self-pay

## 2019-01-17 DIAGNOSIS — S60221A Contusion of right hand, initial encounter: Secondary | ICD-10-CM

## 2019-01-17 DIAGNOSIS — M79641 Pain in right hand: Secondary | ICD-10-CM
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
## 2019-01-17 NOTE — Discharge Instructions (Addendum)
Ice and elevation for pain Limit use of hand while painful May take acetaminophen or ibuprofen for pain Return as needed

## 2019-01-17 NOTE — ED Triage Notes (Signed)
Pt states she punched a door Sunday night several times. Swelling to R hand and R fingers.

## 2019-03-12 ENCOUNTER — Other Ambulatory Visit: Payer: Self-pay | Admitting: Family Medicine

## 2019-03-12 ENCOUNTER — Other Ambulatory Visit: Payer: Self-pay | Admitting: Urgent Care

## 2020-03-02 IMAGING — DX DG HAND COMPLETE 3+V*R*
3 series · 3 of 3 positions shown · non-contrast
Comparison: None.

CLINICAL DATA: Patient states that she punched a door x 2 days ago,
pain along entire hand. Hx of 5th metacarpal fracture.

EXAM:
RIGHT HAND - COMPLETE 3+ VIEW

[hand pa]
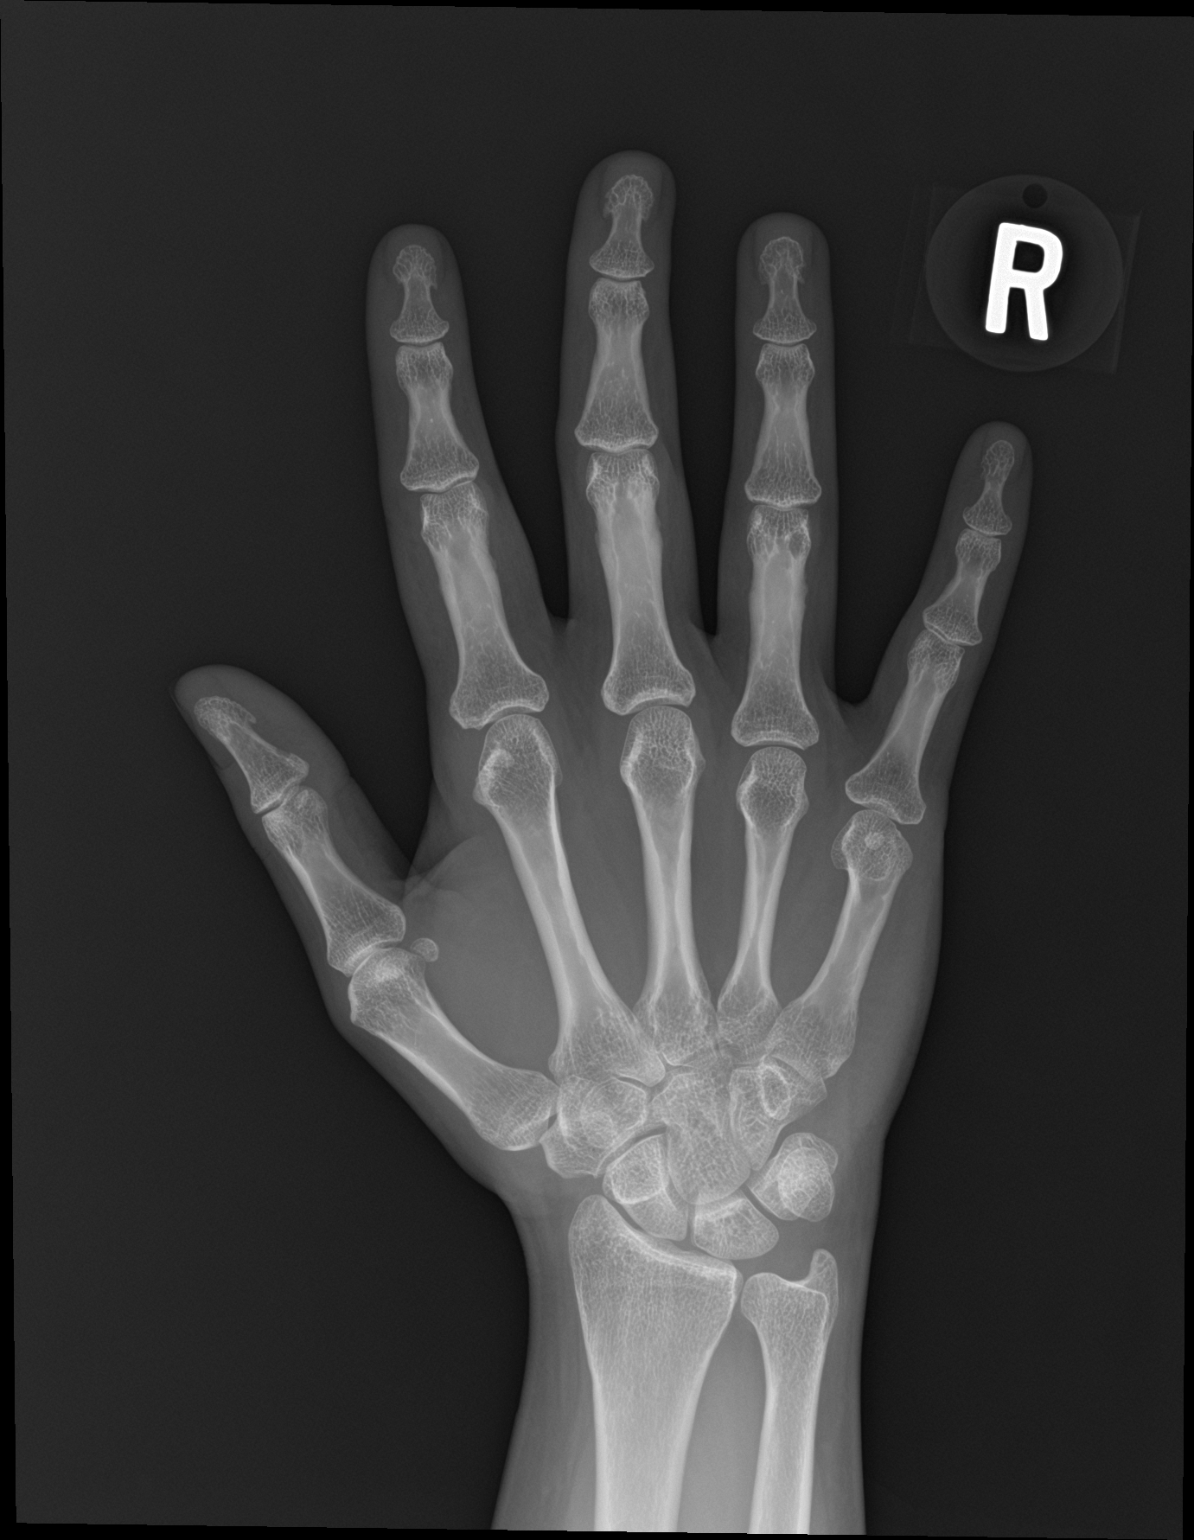

[hand obl]
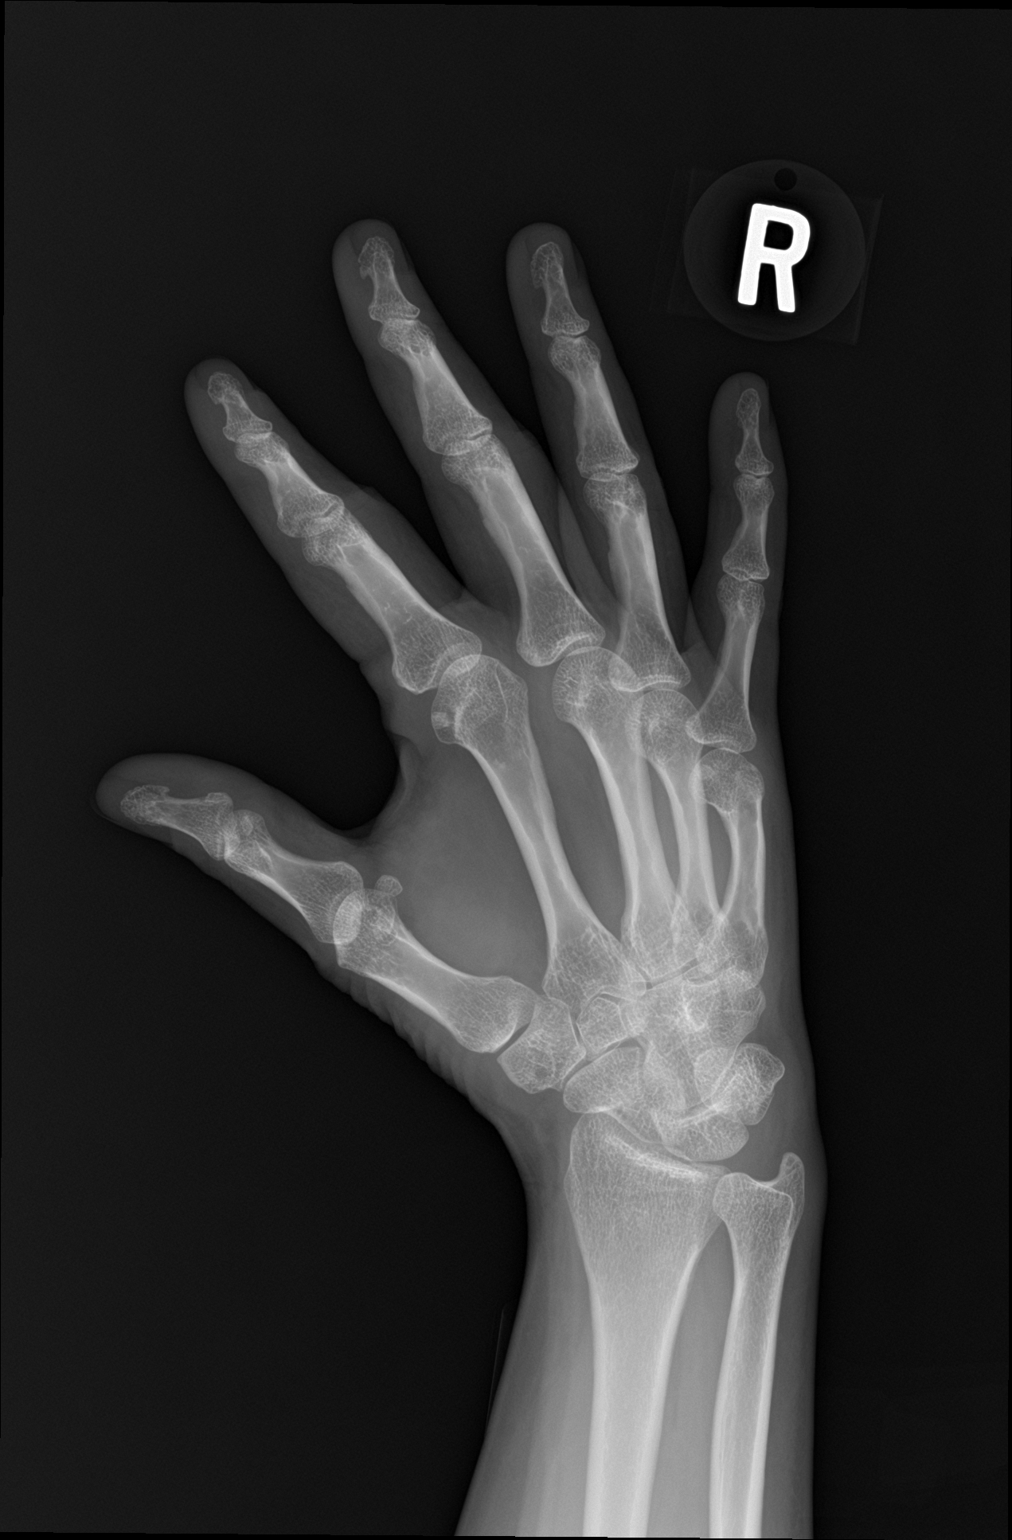

[hand lat]
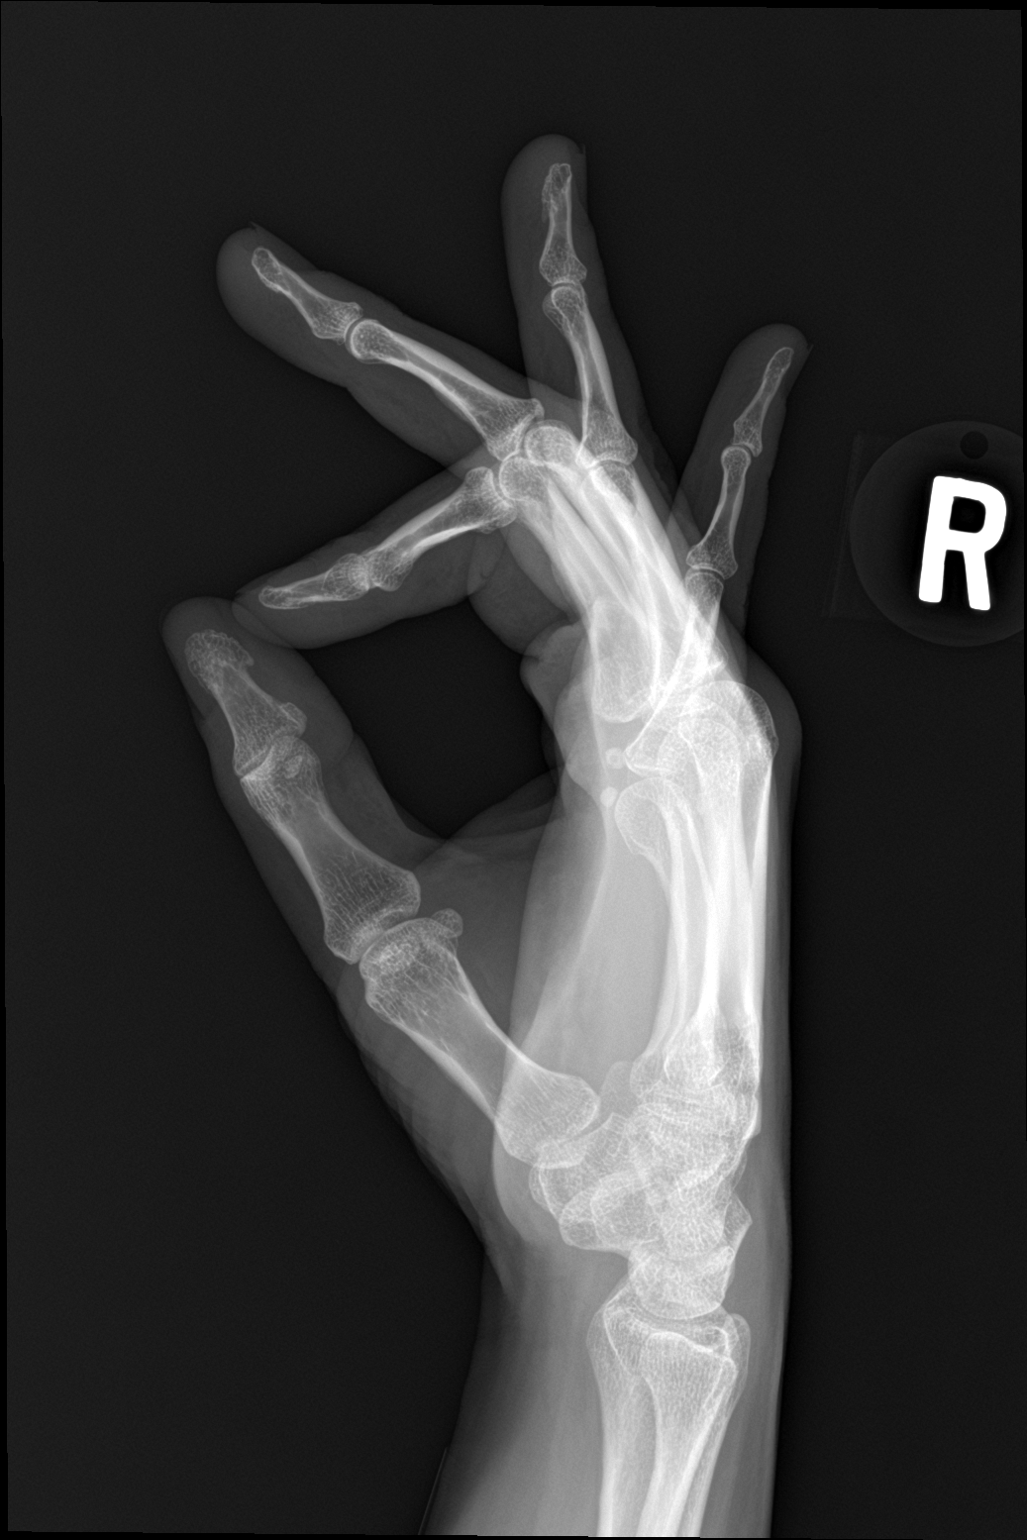

[3 of 3 positions shown; findings below may reference images not displayed]

FINDINGS: There is no evidence of fracture or dislocation. There is no
evidence of arthropathy or other focal bone abnormality. Soft
tissues are unremarkable.
IMPRESSION: Negative.

## 2020-03-06 ENCOUNTER — Encounter: Payer: Self-pay | Admitting: Certified Nurse Midwife

## 2021-07-23 ENCOUNTER — Ambulatory Visit
Admission: EM | Admit: 2021-07-23 | Discharge: 2021-07-23 | Disposition: A | Discharge status: Home / Self Care | Payer: Medicaid Other

## 2021-07-23 ENCOUNTER — Other Ambulatory Visit: Payer: Self-pay

## 2021-07-23 DIAGNOSIS — L089 Local infection of the skin and subcutaneous tissue, unspecified: Secondary | ICD-10-CM

## 2021-07-23 DIAGNOSIS — T148XXA Other injury of unspecified body region, initial encounter: Secondary | ICD-10-CM | POA: Diagnosis not present

## 2021-07-23 DIAGNOSIS — M79605 Pain in left leg: Secondary | ICD-10-CM

## 2021-07-23 MED ORDER — NAPROXEN 500 MG PO TABS: 500.0000 mg | ORAL_TABLET | Freq: Two times a day (BID) | ORAL | 0 refills | Status: DC

## 2021-07-23 MED ORDER — DOXYCYCLINE HYCLATE 100 MG PO CAPS: 100.0000 mg | ORAL_CAPSULE | Freq: Two times a day (BID) | ORAL | 0 refills | Status: DC

## 2021-07-23 NOTE — ED Triage Notes (Signed)
Pt c/o possible snake bite to lt ankle 2wks ago. States was seen in ED and UC last week. States given antibiotics but didn't feel it. States wound to lt ankle has worsen in pain.

## 2021-07-23 NOTE — ED Provider Notes (Signed)
[facility]   MRN: [id number] DOB: 05-27-1986  Subjective:   Rebecca Gould is a 35 y.o. female presenting for 2-week history of a persistent wound over her left lower leg. Was initially advised that she had contact dermatitis and was prescribed a steroid cream which she did not use.  A different provider told her that she may have had a snakebite and subsequently was prescribed mupirocin, cephalexin for left leg cellulitis. Patient has been able to bear weight and ambulate at normal pace albeit with pain.  She has actually not taken any of the medications prescribed to her and is now worse.  Denies fever, red streaking, nausea, vomiting, abdominal pain.  No current facility-administered medications for this encounter.  Current Outpatient Medications:    ALPRAZolam (XANAX) 0.5 MG tablet, TAKE 1 TABLET BY MOUTH AT BEDTIME AS NEEDED FOR ANXIETY, Disp: 20 tablet, Rfl: 0   buPROPion (WELLBUTRIN XL) 300 MG 24 hr tablet, Take 300 mg by mouth daily., Disp: , Rfl:    sulfaSALAzine (AZULFIDINE) 500 MG tablet, Take 1 tablet (500 mg total) by mouth daily., Disp: 90 tablet, Rfl: 3    Allergies  Allergen Reactions   Latex     Past Medical History:  Diagnosis Date   Anxiety    Depression    Psoriatic arthritis (HCC)      No past surgical history on file.  No family history on file.  Social History   Tobacco Use   Smoking status: Never   Smokeless tobacco: Never    ROS   Objective:   Vitals: BP 121/82 (BP Location: Left Arm)   Pulse 92   Temp 98 F (36.7 C) (Oral)   Resp 18   SpO2 98%   Physical Exam Constitutional:      General: She is not in acute distress.    Appearance: Normal appearance. She is well-developed. She is not ill-appearing, toxic-appearing or diaphoretic.  HENT:     Head: Normocephalic and atraumatic.     Nose: Nose normal.     Mouth/Throat:     Mouth: Mucous membranes are moist.     Pharynx: Oropharynx is clear.  Eyes:     General: No  scleral icterus.       Right eye: No discharge.        Left eye: No discharge.     Extraocular Movements: Extraocular movements intact.     Conjunctiva/sclera: Conjunctivae normal.     Pupils: Pupils are equal, round, and reactive to light.  Cardiovascular:     Rate and Rhythm: Normal rate.  Pulmonary:     Effort: Pulmonary effort is normal.  Skin:    General: Skin is warm and dry.       Neurological:     General: No focal deficit present.     Mental Status: She is alert and oriented to person, place, and time.  Psychiatric:        Mood and Affect: Mood normal.        Behavior: Behavior normal.        Thought Content: Thought content normal.        Judgment: Judgment normal.      Assessment and Plan :   PDMP not reviewed this encounter.  1. Infected wound   2. Left leg pain     Recommended starting doxycycline, counseled on wound care.  Use naproxen for pain and inflammation.  Counseled patient on potential for adverse effects with medications prescribed/recommended today, ER and return-to-clinic  precautions discussed, patient verbalized understanding.    Wallis Bamberg, PA-C 07/23/21 2007

## 2021-08-28 ENCOUNTER — Encounter (HOSPITAL_BASED_OUTPATIENT_CLINIC_OR_DEPARTMENT_OTHER): Payer: Medicaid Other | Attending: Internal Medicine | Admitting: Internal Medicine

## 2021-10-07 ENCOUNTER — Other Ambulatory Visit: Payer: Self-pay

## 2021-10-07 ENCOUNTER — Encounter: Payer: Self-pay | Admitting: *Deleted

## 2021-10-07 ENCOUNTER — Ambulatory Visit
Admission: EM | Admit: 2021-10-07 | Discharge: 2021-10-07 | Disposition: A | Discharge status: Home / Self Care | Payer: Medicaid Other | Attending: Physician Assistant | Admitting: Physician Assistant

## 2021-10-07 DIAGNOSIS — J069 Acute upper respiratory infection, unspecified: Secondary | ICD-10-CM | POA: Diagnosis not present

## 2021-10-07 LAB — POCT INFLUENZA A/B
Influenza A, POC: NEGATIVE
Influenza B, POC: NEGATIVE

## 2021-10-07 LAB — POCT RAPID STREP A (OFFICE): Rapid Strep A Screen: NEGATIVE

## 2021-10-07 NOTE — ED Provider Notes (Signed)
[facility]    CSN: [id number] Arrival date & time: 10/07/21  1525      History   Chief Complaint Chief Complaint  Patient presents with   Sore Throat   Migraine   Generalized Body Aches   Fever    HPI Rebecca Gould is a 35 y.o. female.   Patient reports that she has had her throat, headache, fever and congestion that started about 2 days ago.  She has had some body aches as well.  She initially told the nurse that symptoms started Thursday and told me that started Wednesday (5 days ago) but states this was a mistake.  She has tried over-the-counter treatment without significant relief.  She is concerned because her symptoms are similar to when she had COVID in the past.  The history is provided by the patient.  Sore Throat Associated symptoms include headaches. Pertinent negatives include no abdominal pain and no shortness of breath.  Migraine Associated symptoms include headaches. Pertinent negatives include no abdominal pain and no shortness of breath.  Fever Associated symptoms: congestion, cough, headaches and sore throat   Associated symptoms: no chills, no diarrhea, no ear pain, no nausea and no vomiting    Past Medical History:  Diagnosis Date   Anxiety    Depression    Psoriatic arthritis (HCC)     There are no problems to display for this patient.   History reviewed. No pertinent surgical history.  OB History   No obstetric history on file.      Home Medications    Prior to Admission medications   Medication Sig Start Date End Date Taking? Authorizing Provider  ALPRAZolam (XANAX) 0.5 MG tablet TAKE 1 TABLET BY MOUTH AT BEDTIME AS NEEDED FOR ANXIETY 12/06/18   Collie Siad A, MD  buPROPion (WELLBUTRIN XL) 300 MG 24 hr tablet Take 300 mg by mouth daily.    [provider]  busPIRone (BUSPAR) 10 MG tablet Take 10 mg by mouth 3 (three) times daily.    [provider]  busPIRone (BUSPAR) 30 MG tablet Take 30 mg by mouth  2 (two) times daily. 07/08/21   [provider]  doxycycline (VIBRAMYCIN) 100 MG capsule Take 1 capsule (100 mg total) by mouth 2 (two) times daily. 07/23/21   Wallis Bamberg, PA-C  lamoTRIgine (LAMICTAL) 25 MG tablet Take by mouth.    [provider]  naproxen (NAPROSYN) 500 MG tablet Take 1 tablet (500 mg total) by mouth 2 (two) times daily with a meal. 07/23/21   Wallis Bamberg, PA-C    Family History History reviewed. No pertinent family history.  Social History Social History   Tobacco Use   Smoking status: Never   Smokeless tobacco: Never     Allergies   Latex   Review of Systems Review of Systems  Constitutional:  Positive for fever. Negative for chills.  HENT:  Positive for congestion, sinus pressure and sore throat. Negative for ear pain.   Eyes:  Negative for discharge and redness.  Respiratory:  Positive for cough. Negative for shortness of breath and wheezing.   Gastrointestinal:  Negative for abdominal pain, diarrhea, nausea and vomiting.  Neurological:  Positive for headaches.    Physical Exam Triage Vital Signs ED Triage Vitals  Enc Vitals Group     BP 10/07/21 1612 131/82     Pulse Rate 10/07/21 1612 86     Resp 10/07/21 1612 18     Temp 10/07/21 1612 98.2 F (36.8 C)  Temp src --      SpO2 10/07/21 1612 100 %     Weight --      Height --      Head Circumference --      Peak Flow --      Pain Score 10/07/21 1614 4     Pain Loc --      Pain Edu? --      Excl. in GC? --    No data found.  Updated Vital Signs BP 131/82   Pulse 86   Temp 98.2 F (36.8 C)   Resp 18   LMP 10/06/2021   SpO2 100%      Physical Exam Vitals and nursing note reviewed.  Constitutional:      General: She is not in acute distress.    Appearance: Normal appearance. She is not ill-appearing.  HENT:     Head: Normocephalic and atraumatic.     Right Ear: Tympanic membrane normal.     Left Ear: Tympanic membrane normal.     Nose: Congestion present.      Mouth/Throat:     Mouth: Mucous membranes are moist.     Pharynx: No oropharyngeal exudate or posterior oropharyngeal erythema.  Eyes:     Conjunctiva/sclera: Conjunctivae normal.  Cardiovascular:     Rate and Rhythm: Normal rate and regular rhythm.     Heart sounds: Normal heart sounds. No murmur heard. Pulmonary:     Effort: Pulmonary effort is normal. No respiratory distress.     Breath sounds: Normal breath sounds. No wheezing, rhonchi or rales.  Skin:    General: Skin is warm and dry.  Neurological:     Mental Status: She is alert.  Psychiatric:        Mood and Affect: Mood normal.        Thought Content: Thought content normal.     UC Treatments / Results  Labs (all labs ordered are listed, but only abnormal results are displayed) Labs Reviewed  NOVEL CORONAVIRUS, NAA  POCT INFLUENZA A/B  POCT RAPID STREP A (OFFICE)    EKG   Radiology No results found.  Procedures Procedures (including critical care time)  Medications Ordered in UC Medications - No data to display  Initial Impression / Assessment and Plan / UC Course  I have reviewed the triage vital signs and the nursing notes.  Pertinent labs & imaging results that were available during my care of the patient were reviewed by me and considered in my medical decision making (see chart for details).  Suspect likely viral etiology of symptoms, strep and flu negative in office.  COVID screening ordered.  Recommend further evaluation if symptoms fail to improve or worsen.  Final Clinical Impressions(s) / UC Diagnoses   Final diagnoses:  Acute upper respiratory infection   Discharge Instructions   None    ED Prescriptions   None    PDMP not reviewed this encounter.   Tomi Bamberger, PA-C 10/07/21 1708

## 2021-10-07 NOTE — ED Triage Notes (Signed)
Pt reports Sx's started on Thursday with HA,sore throat ,fatigue and fever

## 2021-10-08 LAB — SARS-COV-2, NAA 2 DAY TAT

## 2021-10-08 LAB — NOVEL CORONAVIRUS, NAA: SARS-CoV-2, NAA: NOT DETECTED

## 2021-10-14 ENCOUNTER — Encounter (HOSPITAL_BASED_OUTPATIENT_CLINIC_OR_DEPARTMENT_OTHER): Payer: Self-pay | Admitting: *Deleted

## 2021-10-14 ENCOUNTER — Emergency Department (HOSPITAL_BASED_OUTPATIENT_CLINIC_OR_DEPARTMENT_OTHER)
Admission: EM | Admit: 2021-10-14 | Discharge: 2021-10-15 | Disposition: A | Discharge status: Home / Self Care | Payer: Medicaid Other | Attending: Emergency Medicine | Admitting: Emergency Medicine

## 2021-10-14 ENCOUNTER — Ambulatory Visit
Admission: EM | Admit: 2021-10-14 | Discharge: 2021-10-14 | Disposition: A | Discharge status: Home / Self Care | Payer: Medicaid Other

## 2021-10-14 ENCOUNTER — Encounter: Payer: Self-pay | Admitting: Emergency Medicine

## 2021-10-14 ENCOUNTER — Other Ambulatory Visit: Payer: Self-pay

## 2021-10-14 DIAGNOSIS — R1013 Epigastric pain: Secondary | ICD-10-CM | POA: Diagnosis not present

## 2021-10-14 DIAGNOSIS — R1011 Right upper quadrant pain: Secondary | ICD-10-CM

## 2021-10-14 DIAGNOSIS — Z91040 Latex allergy status: Secondary | ICD-10-CM | POA: Insufficient documentation

## 2021-10-14 LAB — URINALYSIS, ROUTINE W REFLEX MICROSCOPIC
Bilirubin Urine: NEGATIVE
Glucose, UA: NEGATIVE mg/dL
Hgb urine dipstick: NEGATIVE
Ketones, ur: NEGATIVE mg/dL
Leukocytes,Ua: NEGATIVE
Nitrite: NEGATIVE
Protein, ur: NEGATIVE mg/dL
Specific Gravity, Urine: 1.025 (ref 1.005–1.030)
pH: 6 (ref 5.0–8.0)

## 2021-10-14 LAB — COMPREHENSIVE METABOLIC PANEL
ALT: 6 U/L (ref 0–44)
AST: 12 U/L — ABNORMAL LOW (ref 15–41)
Albumin: 4.4 g/dL (ref 3.5–5.0)
Alkaline Phosphatase: 44 U/L (ref 38–126)
Anion gap: 8 (ref 5–15)
BUN: 13 mg/dL (ref 6–20)
CO2: 25 mmol/L (ref 22–32)
Calcium: 8.9 mg/dL (ref 8.9–10.3)
Chloride: 105 mmol/L (ref 98–111)
Creatinine, Ser: 0.8 mg/dL (ref 0.44–1.00)
GFR, Estimated: >60 mL/min (ref >60)
Glucose, Bld: 84 mg/dL (ref 70–99)
Potassium: 3.4 mmol/L — ABNORMAL LOW (ref 3.5–5.1)
Sodium: 138 mmol/L (ref 135–145)
Total Bilirubin: 0.8 mg/dL (ref 0.3–1.2)
Total Protein: 7.2 g/dL (ref 6.5–8.1)

## 2021-10-14 LAB — CBC
HCT: 35.8 % — ABNORMAL LOW (ref 36.0–46.0)
Hemoglobin: 11.5 g/dL — ABNORMAL LOW (ref 12.0–15.0)
MCH: 26.4 pg (ref 26.0–34.0)
MCHC: 32.1 g/dL (ref 30.0–36.0)
MCV: 82.3 fL (ref 80.0–100.0)
Platelets: 357 10*3/uL (ref 150–400)
RBC: 4.35 MIL/uL (ref 3.87–5.11)
RDW: 14.6 % (ref 11.5–15.5)
WBC: 7.7 10*3/uL (ref 4.0–10.5)
nRBC: 0 % (ref 0.0–0.2)

## 2021-10-14 LAB — LIPASE, BLOOD: Lipase: 14 U/L (ref 11–51)

## 2021-10-14 LAB — PREGNANCY, URINE: Preg Test, Ur: NEGATIVE

## 2021-10-14 MED ORDER — ALUM & MAG HYDROXIDE-SIMETH 200-200-20 MG/5ML PO SUSP
30.0000 mL | Freq: Once | ORAL | Status: AC
  Administered 2021-10-14: 30 mL via ORAL
  Filled 2021-10-14: qty 30

## 2021-10-14 MED ORDER — LIDOCAINE VISCOUS HCL 2 % MT SOLN
15.0000 mL | Freq: Once | OROMUCOSAL | Status: AC
  Administered 2021-10-14: 15 mL via ORAL
  Filled 2021-10-14: qty 15

## 2021-10-14 NOTE — ED Triage Notes (Signed)
Epigastric stabbing pain radiating into back and right arm increasing over the last 4 days. Causing nausea, reports diarrhea that was gray/clay colored and floating. Denies vomiting, fever, radiation into left arm, chest, or jaw. Pain worsens with eating food. Heat and famotidine did not improve pain. Reports taking NSAIDS regularly, not always with food.

## 2021-10-14 NOTE — ED Triage Notes (Signed)
Pt reports epigastric pain x 5 days. Worse after eating. Seen at Urgent Care and sent here for labs and [place]

## 2021-10-14 NOTE — Discharge Instructions (Addendum)
Advised follow up in ED for further evaluation of RUQ pain

## 2021-10-14 NOTE — ED Provider Notes (Signed)
[facility] EMERGENCY DEPT Provider Note   CSN: [id number] Arrival date & time: 10/14/21  1853     History Chief Complaint  Patient presents with   Abdominal Pain    Rebecca Gould is a 35 y.o. female.  Patient is a 35 year old female with history of anxiety/depression.  Patient presenting today with complaints of epigastric pain.  This has been present for the past 5 days.  Pain is mostly a dull ache, but becomes sharp and worsens when she eats.  She denies any fevers or chills.  She does report some diarrhea that is nonbloody.  She denies to me she is having any fevers or chills.  She denies any ill contacts.  She was sent here from urgent care for laboratory work and ultrasound.  The history is provided by the patient.  Abdominal Pain Pain location:  Epigastric Pain quality: cramping   Pain radiates to:  Does not radiate Pain severity:  Moderate Onset quality:  Gradual Duration:  5 days Timing:  Constant Progression:  Worsening Chronicity:  New Relieved by:  Nothing Worsened by:  Movement and palpation Ineffective treatments:  None tried     Past Medical History:  Diagnosis Date   Anxiety    Depression    Psoriatic arthritis (HCC)     There are no problems to display for this patient.   History reviewed. No pertinent surgical history.   OB History   No obstetric history on file.     No family history on file.  Social History   Tobacco Use   Smoking status: Never   Smokeless tobacco: Never  Substance Use Topics   Alcohol use: Yes    Comment: occasional   Drug use: Never    Home Medications Prior to Admission medications   Medication Sig Start Date End Date Taking? Authorizing Provider  ALPRAZolam (XANAX) 0.5 MG tablet TAKE 1 TABLET BY MOUTH AT BEDTIME AS NEEDED FOR ANXIETY 12/06/18   Collie Siad A, MD  buPROPion (WELLBUTRIN XL) 300 MG 24 hr tablet Take 300 mg by mouth daily.    [provider]  busPIRone (BUSPAR) 10 MG  tablet Take 10 mg by mouth 3 (three) times daily.    [provider]  busPIRone (BUSPAR) 30 MG tablet Take 30 mg by mouth 2 (two) times daily. 07/08/21   [provider]  doxycycline (VIBRAMYCIN) 100 MG capsule Take 1 capsule (100 mg total) by mouth 2 (two) times daily. 07/23/21   Wallis Bamberg, PA-C  lamoTRIgine (LAMICTAL) 25 MG tablet Take by mouth.    [provider]  naproxen (NAPROSYN) 500 MG tablet Take 1 tablet (500 mg total) by mouth 2 (two) times daily with a meal. 07/23/21   Wallis Bamberg, PA-C    Allergies    Latex  Review of Systems   Review of Systems  Gastrointestinal:  Positive for abdominal pain.  All other systems reviewed and are negative.  Physical Exam Updated Vital Signs BP 132/87 (BP Location: Right Arm)   Pulse 81   Temp 98.1 F (36.7 C)   Resp 16   Ht 5\' 1"  (1.549 m)   Wt 54.4 kg   LMP 10/06/2021   SpO2 99%   BMI 22.67 kg/m   Physical Exam Vitals and nursing note reviewed.  Constitutional:      General: She is not in acute distress.    Appearance: She is well-developed. She is not diaphoretic.  HENT:     Head: Normocephalic and atraumatic.  Cardiovascular:     Rate and Rhythm: Normal rate and regular rhythm.     Heart sounds: No murmur heard.   No friction rub. No gallop.  Pulmonary:     Effort: Pulmonary effort is normal. No respiratory distress.     Breath sounds: Normal breath sounds. No wheezing.  Abdominal:     General: Bowel sounds are normal. There is no distension.     Palpations: Abdomen is soft.     Tenderness: There is abdominal tenderness in the epigastric area. There is no right CVA tenderness, left CVA tenderness, guarding or rebound.  Musculoskeletal:        General: Normal range of motion.     Cervical back: Normal range of motion and neck supple.  Skin:    General: Skin is warm and dry.  Neurological:     General: No focal deficit present.     Mental Status: She is alert and oriented to person, place,  and time.    ED Results / Procedures / Treatments   Labs (all labs ordered are listed, but only abnormal results are displayed) Labs Reviewed  COMPREHENSIVE METABOLIC PANEL - Abnormal; Notable for the following components:      Result Value   Potassium 3.4 (*)    AST 12 (*)    All other components within normal limits  CBC - Abnormal; Notable for the following components:   Hemoglobin 11.5 (*)    HCT 35.8 (*)    All other components within normal limits  LIPASE, BLOOD  URINALYSIS, ROUTINE W REFLEX MICROSCOPIC  PREGNANCY, URINE    EKG None  Radiology No results found.  Procedures Procedures   Medications Ordered in ED Medications  alum & mag hydroxide-simeth (MAALOX/MYLANTA) 200-200-20 MG/5ML suspension 30 mL (has no administration in time range)    And  lidocaine (XYLOCAINE) 2 % viscous mouth solution 15 mL (has no administration in time range)    ED Course  I have reviewed the triage vital signs and the nursing notes.  Pertinent labs & imaging results that were available during my care of the patient were reviewed by me and considered in my medical decision making (see chart for details).    MDM Rules/Calculators/A&P  Patient presenting here with complaints of abdominal pain as described in the HPI.  She is tender across the epigastric region, but laboratory studies are reassuring.  Her ultrasound shows no obvious abnormalities and CT scan is negative for acute intra-abdominal process.  I am uncertain as to the etiology of her discomfort, however nothing tonight appears emergent.  Patient to be prescribed tramadol and advised to follow-up if not improving/return if worsens.  Final Clinical Impression(s) / ED Diagnoses Final diagnoses:  Epigastric pain    Rx / DC Orders ED Discharge Orders     None        Geoffery Lyons, MD 10/15/21 [phone]

## 2021-10-14 NOTE — ED Provider Notes (Signed)
[facility]    CSN: [id number] Arrival date & time: 10/14/21  1654      History   Chief Complaint Chief Complaint  Patient presents with   Abdominal Pain   Nausea    HPI Rebecca Gould is a 35 y.o. female.   Pt complains of epigastric that started several days ago.  Reports pain is worse after eating.  She complains of some nausea.  Denies vomiting, lower abdominal pain, dysuria, flank pain.  Reports radiation of pain to her back today.  She has tried antiacids, NSAIDS, heat with no improvement.  Denies reflux.     Past Medical History:  Diagnosis Date   Anxiety    Depression    Psoriatic arthritis (HCC)     There are no problems to display for this patient.   History reviewed. No pertinent surgical history.  OB History   No obstetric history on file.      Home Medications    Prior to Admission medications   Medication Sig Start Date End Date Taking? Authorizing Provider  ALPRAZolam (XANAX) 0.5 MG tablet TAKE 1 TABLET BY MOUTH AT BEDTIME AS NEEDED FOR ANXIETY 12/06/18   Collie Siad A, MD  buPROPion (WELLBUTRIN XL) 300 MG 24 hr tablet Take 300 mg by mouth daily.    [provider]  busPIRone (BUSPAR) 10 MG tablet Take 10 mg by mouth 3 (three) times daily.    [provider]  busPIRone (BUSPAR) 30 MG tablet Take 30 mg by mouth 2 (two) times daily. 07/08/21   [provider]  doxycycline (VIBRAMYCIN) 100 MG capsule Take 1 capsule (100 mg total) by mouth 2 (two) times daily. 07/23/21   Wallis Bamberg, PA-C  lamoTRIgine (LAMICTAL) 25 MG tablet Take by mouth.    [provider]  naproxen (NAPROSYN) 500 MG tablet Take 1 tablet (500 mg total) by mouth 2 (two) times daily with a meal. 07/23/21   Wallis Bamberg, PA-C    Family History History reviewed. No pertinent family history.  Social History Social History   Tobacco Use   Smoking status: Never   Smokeless tobacco: Never     Allergies   Latex   Review of  Systems Review of Systems  Constitutional:  Negative for chills and fever.  HENT:  Negative for ear pain and sore throat.   Eyes:  Negative for pain and visual disturbance.  Respiratory:  Negative for cough and shortness of breath.   Cardiovascular:  Negative for chest pain and palpitations.  Gastrointestinal:  Positive for abdominal pain and nausea. Negative for vomiting.  Genitourinary:  Negative for dysuria and hematuria.  Musculoskeletal:  Positive for back pain. Negative for arthralgias.  Skin:  Negative for color change and rash.  Neurological:  Negative for seizures and syncope.  All other systems reviewed and are negative.   Physical Exam Triage Vital Signs ED Triage Vitals [10/14/21 1738]  Enc Vitals Group     BP      Pulse      Resp      Temp      Temp src      SpO2      Weight      Height      Head Circumference      Peak Flow      Pain Score 6     Pain Loc      Pain Edu?      Excl. in GC?    No data found.  Updated Vital Signs LMP 10/06/2021   Visual Acuity Right Eye Distance:   Left Eye Distance:   Bilateral Distance:    Right Eye Near:   Left Eye Near:    Bilateral Near:     Physical Exam Vitals and nursing note reviewed.  Constitutional:      General: She is not in acute distress.    Appearance: She is well-developed.  HENT:     Head: Normocephalic and atraumatic.  Eyes:     Conjunctiva/sclera: Conjunctivae normal.  Cardiovascular:     Rate and Rhythm: Normal rate and regular rhythm.     Heart sounds: No murmur heard. Pulmonary:     Effort: Pulmonary effort is normal. No respiratory distress.     Breath sounds: Normal breath sounds.  Abdominal:     General: Bowel sounds are normal.     Palpations: Abdomen is soft.     Tenderness: There is abdominal tenderness in the right upper quadrant and epigastric area. There is no right CVA tenderness or left CVA tenderness.  Musculoskeletal:     Cervical back: Neck supple.  Skin:    General:  Skin is warm and dry.  Neurological:     Mental Status: She is alert.     UC Treatments / Results  Labs (all labs ordered are listed, but only abnormal results are displayed) Labs Reviewed - No data to display  EKG   Radiology No results found.  Procedures Procedures (including critical care time)  Medications Ordered in UC Medications - No data to display  Initial Impression / Assessment and Plan / UC Course  I have reviewed the triage vital signs and the nursing notes.  Pertinent labs & imaging results that were available during my care of the patient were reviewed by me and considered in my medical decision making (see chart for details).     RUQ and epigastric pain.  Due to severity of pain advised evaluation in ED, concern for cholecystitis.  Pt understands and agrees with plan.  Final Clinical Impressions(s) / UC Diagnoses   Final diagnoses:  RUQ pain     Discharge Instructions      Advised follow up in ED for further evaluation of RUQ pain     ED Prescriptions   None    PDMP not reviewed this encounter.   Ward, Lenise Arena, PA-C 10/14/21 1810

## 2021-10-15 ENCOUNTER — Emergency Department (HOSPITAL_BASED_OUTPATIENT_CLINIC_OR_DEPARTMENT_OTHER): Payer: Medicaid Other

## 2021-10-15 ENCOUNTER — Other Ambulatory Visit (HOSPITAL_BASED_OUTPATIENT_CLINIC_OR_DEPARTMENT_OTHER): Payer: Self-pay

## 2021-10-15 MED ORDER — IOHEXOL 300 MG/ML  SOLN
100.0000 mL | Freq: Once | INTRAMUSCULAR | Status: AC | PRN
  Administered 2021-10-15: 75 mL via INTRAVENOUS

## 2021-10-15 MED ORDER — TRAMADOL HCL 50 MG PO TABS: 50.0000 mg | ORAL_TABLET | Freq: Four times a day (QID) | ORAL | 0 refills | Status: DC | PRN

## 2021-10-15 NOTE — Discharge Instructions (Addendum)
Begin taking tramadol as prescribed.  Follow-up with primary doctor if not improving in the next week, and return to the ER if you develop worsening pain, high fever, bloody stool or vomit, or other new and concerning symptoms.

## 2022-01-02 ENCOUNTER — Ambulatory Visit
Admission: EM | Admit: 2022-01-02 | Discharge: 2022-01-02 | Disposition: A | Discharge status: Home / Self Care | Payer: Medicaid Other

## 2022-01-02 ENCOUNTER — Other Ambulatory Visit: Payer: Self-pay

## 2022-01-02 DIAGNOSIS — J069 Acute upper respiratory infection, unspecified: Secondary | ICD-10-CM | POA: Diagnosis not present

## 2022-01-02 LAB — POCT INFLUENZA A/B
Influenza A, POC: NEGATIVE
Influenza B, POC: NEGATIVE

## 2022-01-02 NOTE — ED Provider Notes (Signed)
[facility]    CSN: [id number] Arrival date & time: 01/02/22  1835      History   Chief Complaint Chief Complaint  Patient presents with   Cough    HPI Rebecca Gould is a 36 y.o. female.   Patient here today for evaluation of nasal congestion, sore throat, cough, body aches, chills, fatigue and diarrhea that started 2 days ago. She recently went on a trip with her mother to [place] who had similar symptoms but was not tested. She has not had vomiting. She has tried OTC treatment for symptoms with mild relief.   The history is provided by the patient.  Cough Associated symptoms: chills, myalgias and sore throat   Associated symptoms: no ear pain, no eye discharge, no fever, no shortness of breath and no wheezing    Past Medical History:  Diagnosis Date   Anxiety    Depression    Psoriatic arthritis (HCC)     There are no problems to display for this patient.   History reviewed. No pertinent surgical history.  OB History   No obstetric history on file.      Home Medications    Prior to Admission medications   Medication Sig Start Date End Date Taking? Authorizing Provider  ADDERALL XR 5 MG 24 hr capsule Take 5 mg by mouth daily. 12/24/21   [provider]  ALPRAZolam (XANAX) 0.5 MG tablet TAKE 1 TABLET BY MOUTH AT BEDTIME AS NEEDED FOR ANXIETY 12/06/18   Collie Siad A, MD  buPROPion (WELLBUTRIN XL) 300 MG 24 hr tablet Take 300 mg by mouth daily.    [provider]  busPIRone (BUSPAR) 10 MG tablet Take 10 mg by mouth 3 (three) times daily.    [provider]  busPIRone (BUSPAR) 30 MG tablet Take 30 mg by mouth 2 (two) times daily. 07/08/21   [provider]  doxycycline (VIBRAMYCIN) 100 MG capsule Take 1 capsule (100 mg total) by mouth 2 (two) times daily. 07/23/21   Wallis Bamberg, PA-C  lamoTRIgine (LAMICTAL) 25 MG tablet Take by mouth.    [provider]  naproxen (NAPROSYN) 500 MG tablet Take 1 tablet (500 mg  total) by mouth 2 (two) times daily with a meal. 07/23/21   Wallis Bamberg, PA-C  traMADol (ULTRAM) 50 MG tablet Take 1 tablet (50 mg total) by mouth every 6 (six) hours as needed. 10/15/21   Geoffery Lyons, MD    Family History History reviewed. No pertinent family history.  Social History Social History   Tobacco Use   Smoking status: Never   Smokeless tobacco: Never  Substance Use Topics   Alcohol use: Yes    Comment: occasional   Drug use: Never     Allergies   Latex   Review of Systems Review of Systems  Constitutional:  Positive for chills and fatigue. Negative for fever.  HENT:  Positive for congestion and sore throat. Negative for ear pain.   Eyes:  Negative for discharge and redness.  Respiratory:  Positive for cough. Negative for shortness of breath and wheezing.   Gastrointestinal:  Positive for diarrhea. Negative for abdominal pain, nausea and vomiting.  Musculoskeletal:  Positive for myalgias.    Physical Exam Triage Vital Signs ED Triage Vitals [01/02/22 1844]  Enc Vitals Group     BP 112/75     Pulse Rate 98     Resp 18     Temp 98.4 F (36.9 C)     Temp  Source Oral     SpO2 96 %     Weight      Height      Head Circumference      Peak Flow      Pain Score 0     Pain Loc      Pain Edu?      Excl. in GC?    No data found.  Updated Vital Signs BP 112/75 (BP Location: Left Arm)    Pulse 98    Temp 98.4 F (36.9 C) (Oral)    Resp 18    SpO2 96%      Physical Exam Vitals and nursing note reviewed.  Constitutional:      General: She is not in acute distress.    Appearance: Normal appearance. She is not ill-appearing.  HENT:     Head: Normocephalic and atraumatic.     Nose: Congestion present.     Mouth/Throat:     Mouth: Mucous membranes are moist.     Pharynx: No oropharyngeal exudate or posterior oropharyngeal erythema.  Eyes:     Conjunctiva/sclera: Conjunctivae normal.  Cardiovascular:     Rate and Rhythm: Normal rate and regular  rhythm.     Heart sounds: Normal heart sounds. No murmur heard. Pulmonary:     Effort: Pulmonary effort is normal. No respiratory distress.     Breath sounds: Normal breath sounds. No wheezing, rhonchi or rales.  Skin:    General: Skin is warm and dry.  Neurological:     Mental Status: She is alert.  Psychiatric:        Mood and Affect: Mood normal.        Thought Content: Thought content normal.     UC Treatments / Results  Labs (all labs ordered are listed, but only abnormal results are displayed) Labs Reviewed  COVID-19, FLU A+B NAA  POCT INFLUENZA A/B    EKG   Radiology No results found.  Procedures Procedures (including critical care time)  Medications Ordered in UC Medications - No data to display  Initial Impression / Assessment and Plan / UC Course  I have reviewed the triage vital signs and the nursing notes.  Pertinent labs & imaging results that were available during my care of the patient were reviewed by me and considered in my medical decision making (see chart for details).    Flu test negative. Will order covid and flu PCR screening. Recommended symptomatic treatment while awaiting results and follow up with any further concerns.    Final Clinical Impressions(s) / UC Diagnoses   Final diagnoses:  Acute upper respiratory infection   Discharge Instructions   None    ED Prescriptions   None    PDMP not reviewed this encounter.   Tomi Bamberger, PA-C 01/02/22 1914

## 2022-01-02 NOTE — ED Triage Notes (Signed)
Pt c/o cough, sore throat, nasal congestion with drainage, body aches, chills, fatigue, malaise, dizziness, diarrhea.   Denies nausea, vomiting, constipation   Onset ~ 2 days ago.

## 2022-01-03 LAB — COVID-19, FLU A+B NAA
Influenza A, NAA: NOT DETECTED
Influenza B, NAA: NOT DETECTED
SARS-CoV-2, NAA: DETECTED — AB

## 2022-11-29 IMAGING — US US ABDOMEN LIMITED
1 series · 14 of 25 positions shown · non-contrast
Comparison: None.

CLINICAL DATA: Epigastric pain

EXAM:
ULTRASOUND ABDOMEN LIMITED

[Series 1: us abdomen limited ruq (liver/gb) · 14 of 68 slices shown]
[im 1/68]
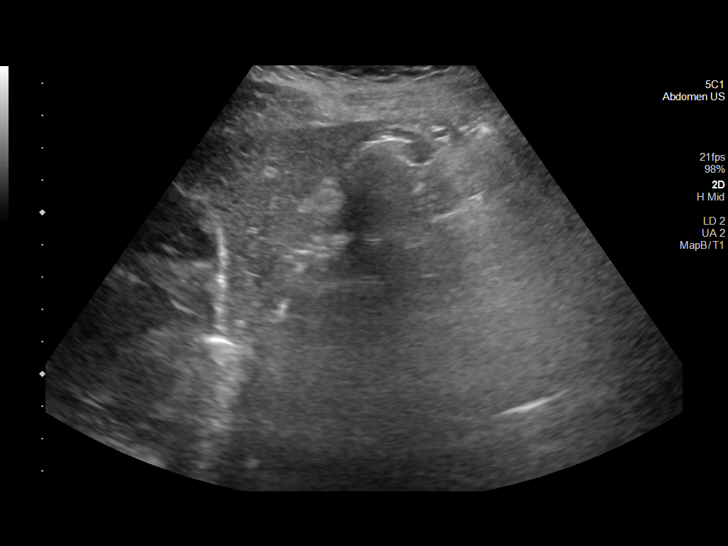
[im 6/68]
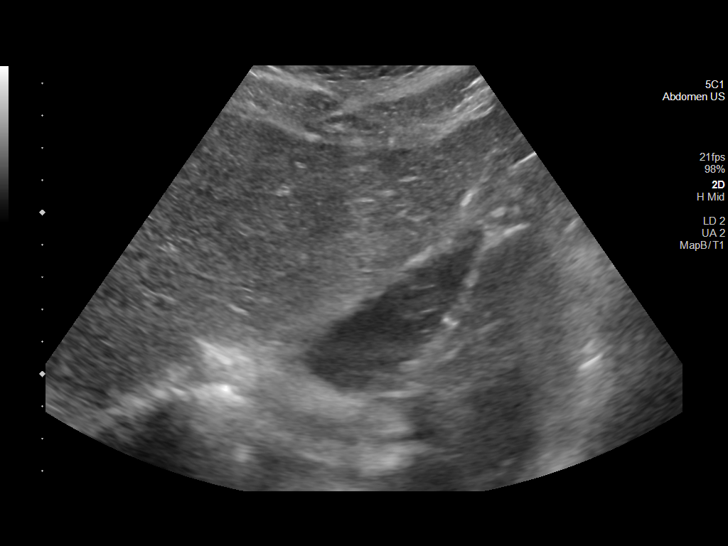
[im 12/68]
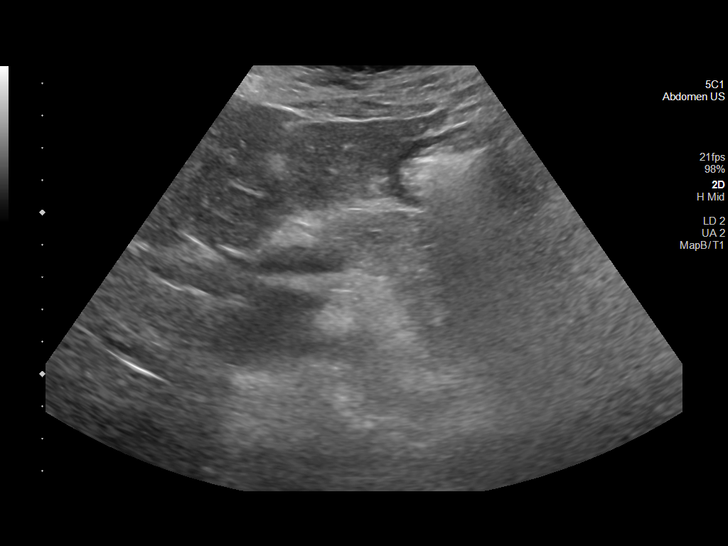
[im 17/68]
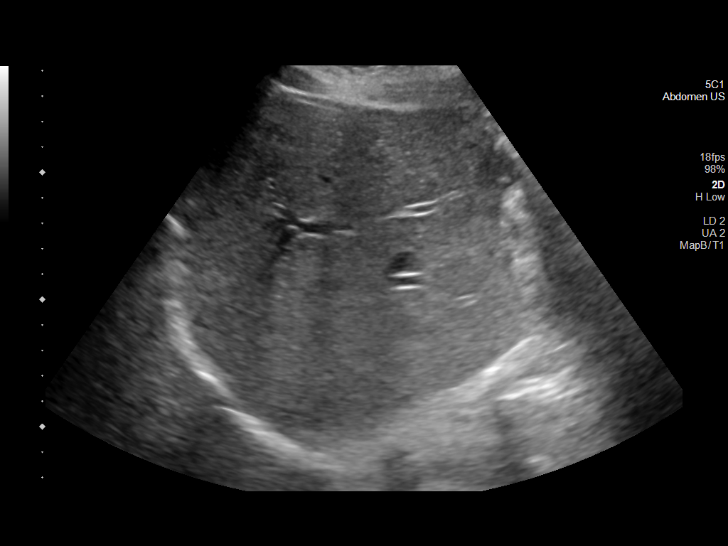
[im 23/68]
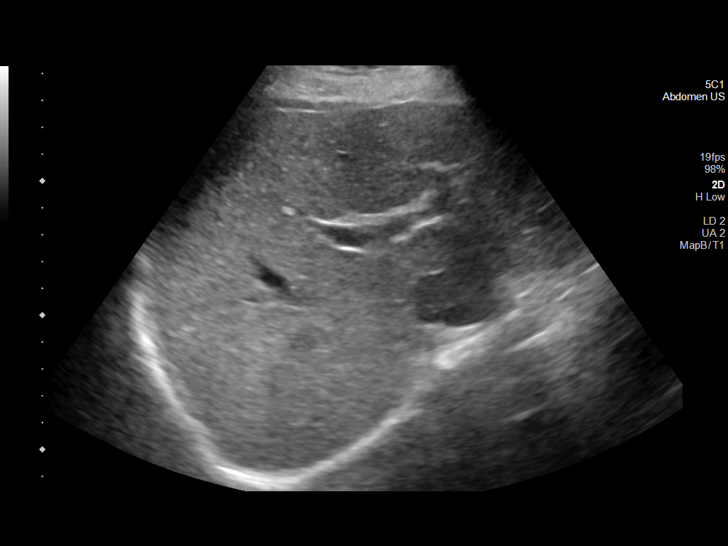
[im 26/68]
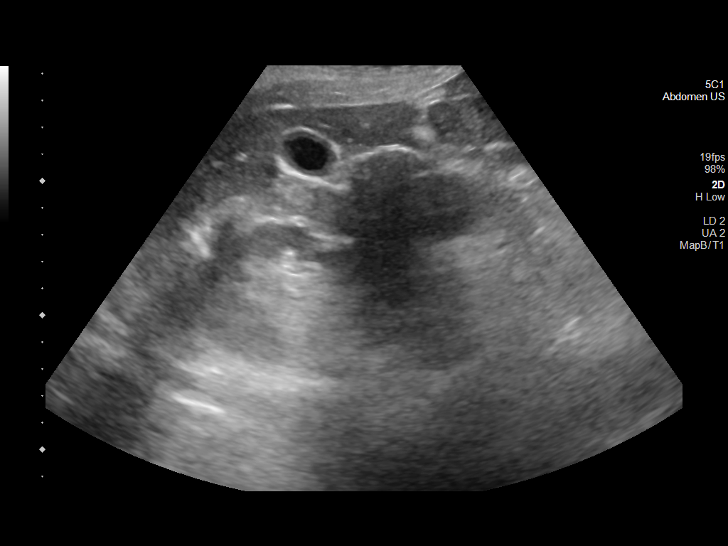
[im 31/68]
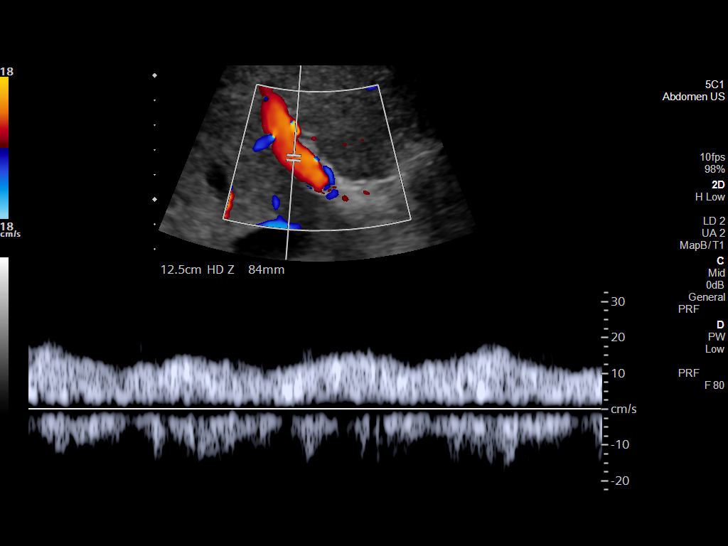
[im 37/68]
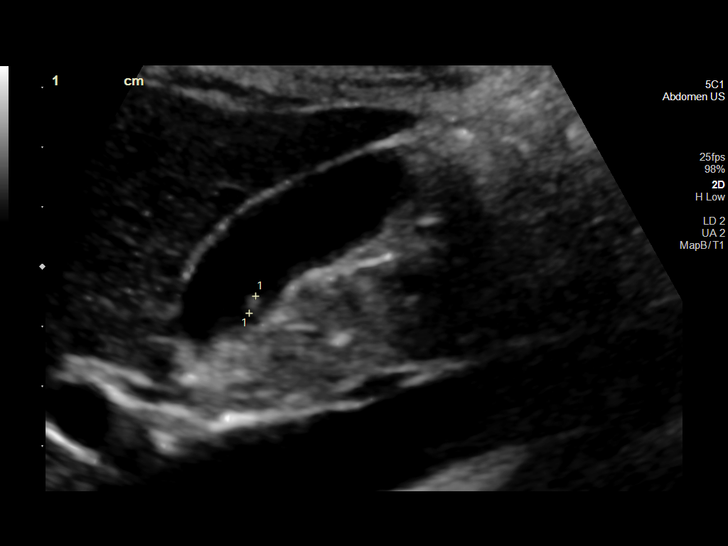
[im 42/68]
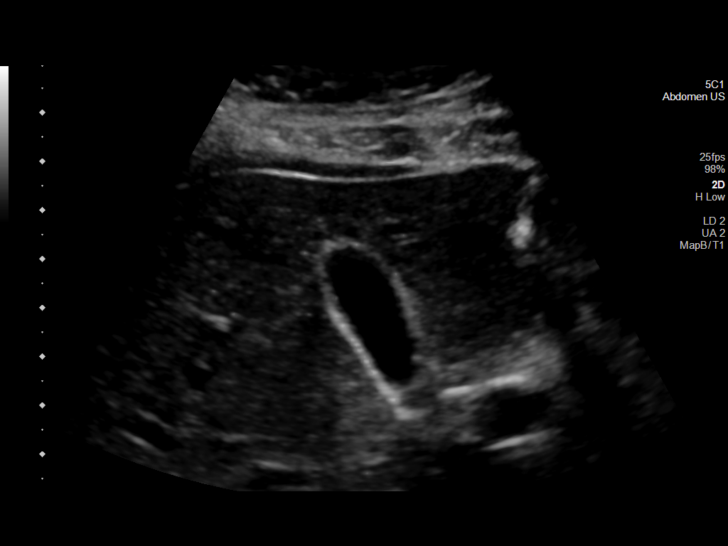
[im 45/68]
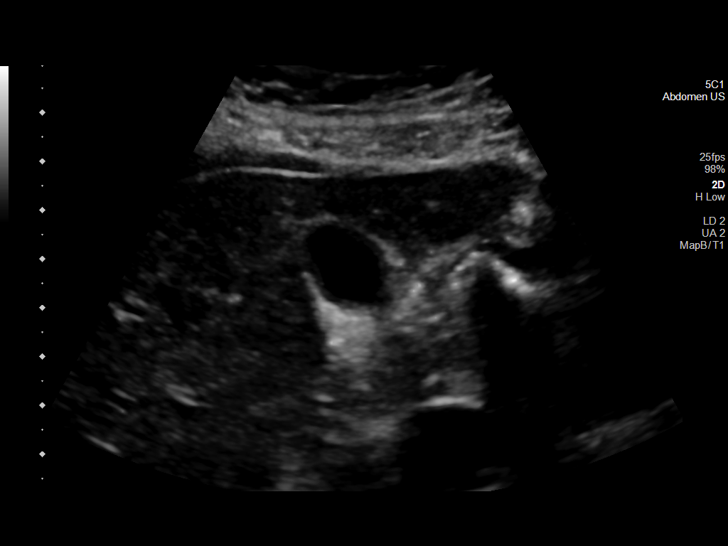
[im 51/68]
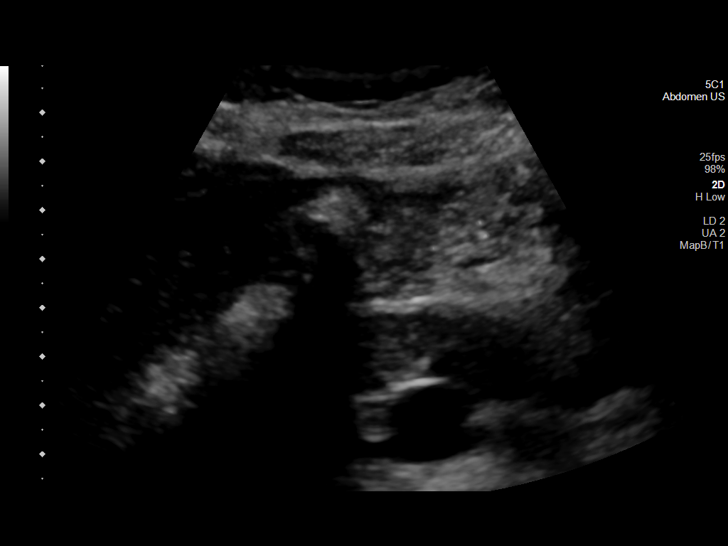
[im 56/68]
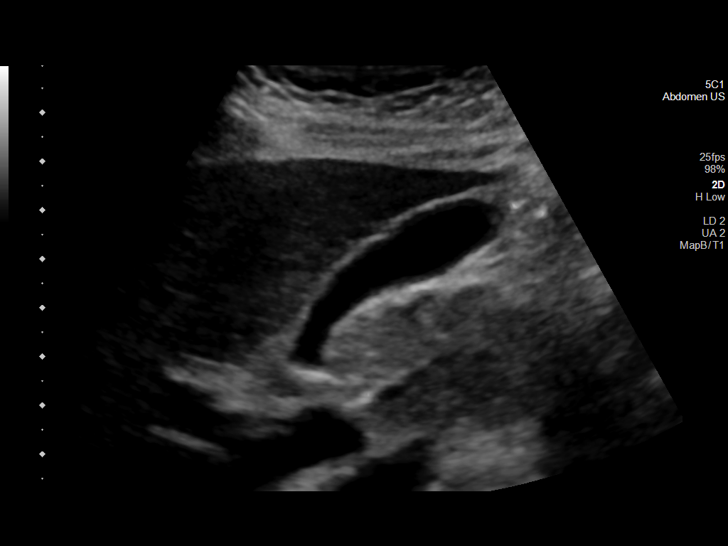
[im 62/68]
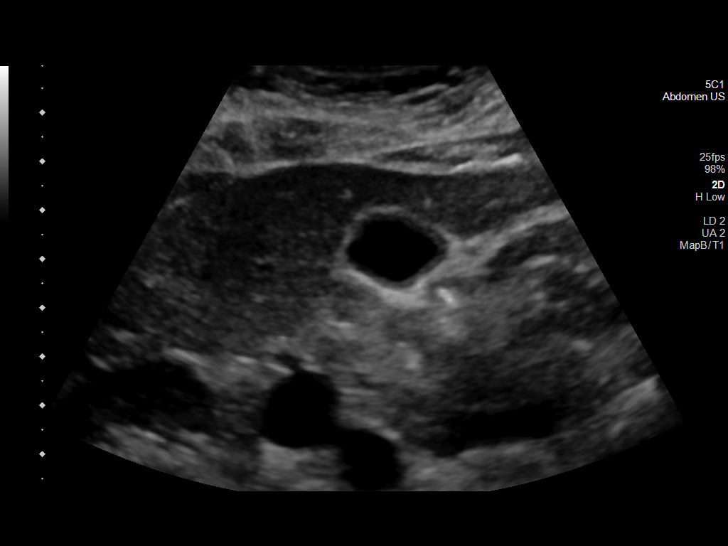
[im 68/68]
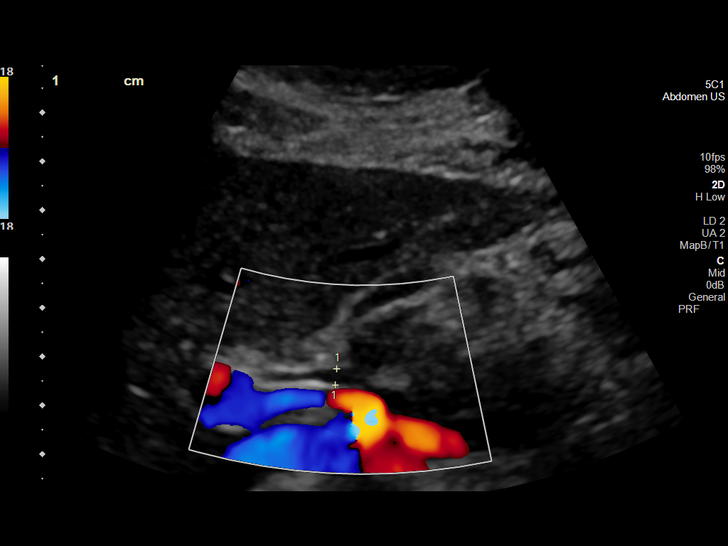

[14 of 25 positions shown; findings below may reference images not displayed]

FINDINGS: Liver is normal in size with increased parenchymal echogenicity
consistent with steatosis. No focal lesion is seen and no
perihepatic ascites.

Gallbladder wall and lumen are unremarkable except for a 3 mm
echogenic polyp of the posterior proximal wall. The technologist did
not report a positive sonographic Murphy's sign. The common bile
duct is normal measuring 3.4 mm. The hepatic portal vein caliber and
directional flow are normal. The pancreas and right kidney are not
evaluated.
IMPRESSION: 1. Probable hepatic steatosis. No hepatic enlargement or visible
focal lesion.
2. 3 mm echogenic posterior gallbladder wall polyp. No shadowing
stones, wall thickening or pericholecystic fluid.
3. No biliary dilatation or ascites.

## 2024-12-16 ENCOUNTER — Encounter (HOSPITAL_BASED_OUTPATIENT_CLINIC_OR_DEPARTMENT_OTHER): Payer: Self-pay

## 2024-12-16 ENCOUNTER — Observation Stay (HOSPITAL_BASED_OUTPATIENT_CLINIC_OR_DEPARTMENT_OTHER)
Admission: EM | Admit: 2024-12-16 | Discharge: 2024-12-17 | Disposition: A | Attending: Emergency Medicine | Admitting: Emergency Medicine

## 2024-12-16 ENCOUNTER — Other Ambulatory Visit: Payer: Self-pay

## 2024-12-16 DIAGNOSIS — K812 Acute cholecystitis with chronic cholecystitis: Principal | ICD-10-CM | POA: Insufficient documentation

## 2024-12-16 DIAGNOSIS — R1011 Right upper quadrant pain: Secondary | ICD-10-CM | POA: Diagnosis present

## 2024-12-16 DIAGNOSIS — K819 Cholecystitis, unspecified: Secondary | ICD-10-CM | POA: Diagnosis present

## 2024-12-16 LAB — CBC
HCT: 38.3 % (ref 36.0–46.0)
Hemoglobin: 13.3 g/dL (ref 12.0–15.0)
MCH: 31.8 pg (ref 26.0–34.0)
MCHC: 34.7 g/dL (ref 30.0–36.0)
MCV: 91.6 fL (ref 80.0–100.0)
Platelets: 251 K/uL (ref 150–400)
RBC: 4.18 MIL/uL (ref 3.87–5.11)
RDW: 13.1 % (ref 11.5–15.5)
WBC: 8.2 K/uL (ref 4.0–10.5)
nRBC: 0 % (ref 0.0–0.2)

## 2024-12-16 NOTE — ED Triage Notes (Signed)
 Pt states that she is having RUQ abd pain that started this evening after eating with radiation to her back, hx of gallstones

## 2024-12-17 ENCOUNTER — Emergency Department (HOSPITAL_COMMUNITY): Admitting: Anesthesiology

## 2024-12-17 ENCOUNTER — Emergency Department (HOSPITAL_BASED_OUTPATIENT_CLINIC_OR_DEPARTMENT_OTHER)

## 2024-12-17 ENCOUNTER — Encounter (HOSPITAL_COMMUNITY): Payer: Self-pay | Admitting: Anesthesiology

## 2024-12-17 ENCOUNTER — Encounter (HOSPITAL_COMMUNITY): Admission: EM | Disposition: A | Payer: Self-pay | Source: Home / Self Care | Attending: Emergency Medicine

## 2024-12-17 DIAGNOSIS — K819 Cholecystitis, unspecified: Secondary | ICD-10-CM | POA: Diagnosis present

## 2024-12-17 DIAGNOSIS — K810 Acute cholecystitis: Secondary | ICD-10-CM

## 2024-12-17 HISTORY — PX: CHOLECYSTECTOMY: SHX55

## 2024-12-17 LAB — COMPREHENSIVE METABOLIC PANEL WITH GFR
ALT: 11 U/L (ref 0–44)
AST: 16 U/L (ref 15–41)
Albumin: 4.5 g/dL (ref 3.5–5.0)
Alkaline Phosphatase: 100 U/L (ref 38–126)
Anion gap: 11 (ref 5–15)
BUN: 15 mg/dL (ref 6–20)
CO2: 24 mmol/L (ref 22–32)
Calcium: 9.1 mg/dL (ref 8.9–10.3)
Chloride: 106 mmol/L (ref 98–111)
Creatinine, Ser: 0.62 mg/dL (ref 0.44–1.00)
GFR, Estimated: >60 mL/min
Glucose, Bld: 101 mg/dL — ABNORMAL HIGH (ref 70–99)
Potassium: 4 mmol/L (ref 3.5–5.1)
Sodium: 141 mmol/L (ref 135–145)
Total Bilirubin: 0.5 mg/dL (ref 0.0–1.2)
Total Protein: 6.9 g/dL (ref 6.5–8.1)

## 2024-12-17 LAB — URINALYSIS, ROUTINE W REFLEX MICROSCOPIC
Bilirubin Urine: NEGATIVE
Glucose, UA: NEGATIVE mg/dL
Ketones, ur: NEGATIVE mg/dL
Leukocytes,Ua: NEGATIVE
Nitrite: NEGATIVE
Protein, ur: NEGATIVE mg/dL
Specific Gravity, Urine: 1.02 (ref 1.005–1.030)
pH: 7 (ref 5.0–8.0)

## 2024-12-17 LAB — LIPASE, BLOOD: Lipase: 50 U/L (ref 11–51)

## 2024-12-17 LAB — PREGNANCY, URINE: Preg Test, Ur: NEGATIVE

## 2024-12-17 MED ORDER — FAMOTIDINE 20 MG PO TABS
40.0000 mg | ORAL_TABLET | Freq: Once | ORAL | Status: AC
  Administered 2024-12-17: 40 mg via ORAL
  Filled 2024-12-17: qty 2

## 2024-12-17 MED ORDER — ONDANSETRON HCL 4 MG/2ML IJ SOLN
4.0000 mg | Freq: Once | INTRAMUSCULAR | Status: AC | PRN
  Administered 2024-12-17: 4 mg via INTRAVENOUS

## 2024-12-17 MED ORDER — METHOCARBAMOL 500 MG PO TABS: 500.0000 mg | ORAL_TABLET | Freq: Four times a day (QID) | ORAL | Status: DC | PRN

## 2024-12-17 MED ORDER — LACTATED RINGERS IV SOLN: INTRAVENOUS | Status: DC | PRN

## 2024-12-17 MED ORDER — ACETAMINOPHEN 10 MG/ML IV SOLN
INTRAVENOUS | Status: AC
  Filled 2024-12-17: qty 100

## 2024-12-17 MED ORDER — SODIUM CHLORIDE 0.9 % IV BOLUS
1000.0000 mL | Freq: Once | INTRAVENOUS | Status: AC
  Administered 2024-12-17: 1000 mL via INTRAVENOUS

## 2024-12-17 MED ORDER — KETOROLAC TROMETHAMINE 30 MG/ML IJ SOLN
INTRAMUSCULAR | Status: AC
  Filled 2024-12-17: qty 1

## 2024-12-17 MED ORDER — KETOROLAC TROMETHAMINE 30 MG/ML IJ SOLN
INTRAMUSCULAR | Status: DC | PRN
  Administered 2024-12-17: 30 mg via INTRAVENOUS

## 2024-12-17 MED ORDER — HYDROMORPHONE HCL 1 MG/ML IJ SOLN
INTRAMUSCULAR | Status: AC
  Filled 2024-12-17: qty 1

## 2024-12-17 MED ORDER — ONDANSETRON HCL 4 MG/2ML IJ SOLN
INTRAMUSCULAR | Status: AC
  Filled 2024-12-17: qty 2

## 2024-12-17 MED ORDER — PROPOFOL 1000 MG/100ML IV EMUL
INTRAVENOUS | Status: AC
  Filled 2024-12-17: qty 100

## 2024-12-17 MED ORDER — LIDOCAINE HCL (CARDIAC) PF 100 MG/5ML IV SOSY
PREFILLED_SYRINGE | INTRAVENOUS | Status: DC | PRN
  Administered 2024-12-17: 60 mg via INTRAVENOUS

## 2024-12-17 MED ORDER — FENTANYL CITRATE (PF) 100 MCG/2ML IJ SOLN
INTRAMUSCULAR | Status: AC
  Filled 2024-12-17: qty 2

## 2024-12-17 MED ORDER — HYDROMORPHONE HCL 1 MG/ML IJ SOLN
0.5000 mg | Freq: Once | INTRAMUSCULAR | Status: AC
  Administered 2024-12-17: 0.5 mg via INTRAVENOUS
  Filled 2024-12-17: qty 1

## 2024-12-17 MED ORDER — HYDROMORPHONE HCL 1 MG/ML IJ SOLN
0.5000 mg | INTRAMUSCULAR | Status: AC | PRN
  Administered 2024-12-17 (×2): 0.5 mg via INTRAVENOUS

## 2024-12-17 MED ORDER — OXYCODONE HCL 5 MG PO TABS: 5.0000 mg | ORAL_TABLET | ORAL | Status: DC | PRN

## 2024-12-17 MED ORDER — PROPOFOL 10 MG/ML IV BOLUS
INTRAVENOUS | Status: AC
  Filled 2024-12-17: qty 20

## 2024-12-17 MED ORDER — SUGAMMADEX SODIUM 200 MG/2ML IV SOLN
INTRAVENOUS | Status: DC | PRN
  Administered 2024-12-17: 200 mg via INTRAVENOUS

## 2024-12-17 MED ORDER — FENTANYL CITRATE (PF) 100 MCG/2ML IJ SOLN
INTRAMUSCULAR | Status: DC | PRN
  Administered 2024-12-17: 100 ug via INTRAVENOUS

## 2024-12-17 MED ORDER — PIPERACILLIN-TAZOBACTAM 3.375 G IVPB 30 MIN
3.3750 g | Freq: Once | INTRAVENOUS | Status: AC
  Administered 2024-12-17: 3.375 g via INTRAVENOUS
  Filled 2024-12-17: qty 50

## 2024-12-17 MED ORDER — ONDANSETRON HCL 4 MG/2ML IJ SOLN
INTRAMUSCULAR | Status: DC | PRN
  Administered 2024-12-17: 4 mg via INTRAVENOUS

## 2024-12-17 MED ORDER — HYDROMORPHONE HCL 1 MG/ML IJ SOLN
1.0000 mg | Freq: Once | INTRAMUSCULAR | Status: AC
  Administered 2024-12-17: 1 mg via INTRAVENOUS
  Filled 2024-12-17: qty 1

## 2024-12-17 MED ORDER — SUGAMMADEX SODIUM 200 MG/2ML IV SOLN
INTRAVENOUS | Status: AC
  Filled 2024-12-17: qty 2

## 2024-12-17 MED ORDER — ACETAMINOPHEN 10 MG/ML IV SOLN
INTRAVENOUS | Status: DC | PRN
  Administered 2024-12-17: 1000 mg via INTRAVENOUS

## 2024-12-17 MED ORDER — SCOPOLAMINE 1 MG/3DAYS TD PT72: 1.0000 | MEDICATED_PATCH | TRANSDERMAL | Status: DC

## 2024-12-17 MED ORDER — PIPERACILLIN-TAZOBACTAM 3.375 G IVPB: 3.3750 g | Freq: Three times a day (TID) | INTRAVENOUS | Status: DC

## 2024-12-17 MED ORDER — ONDANSETRON HCL 4 MG/2ML IJ SOLN
4.0000 mg | Freq: Once | INTRAMUSCULAR | Status: AC
  Administered 2024-12-17: 4 mg via INTRAVENOUS
  Filled 2024-12-17: qty 2

## 2024-12-17 MED ORDER — PROPOFOL 500 MG/50ML IV EMUL
INTRAVENOUS | Status: DC | PRN
  Administered 2024-12-17: 175 ug/kg/min via INTRAVENOUS

## 2024-12-17 MED ORDER — IOHEXOL 350 MG/ML SOLN
80.0000 mL | Freq: Once | INTRAVENOUS | Status: AC | PRN
  Administered 2024-12-17: 80 mL via INTRAVENOUS

## 2024-12-17 MED ORDER — PROPOFOL 10 MG/ML IV BOLUS
INTRAVENOUS | Status: DC | PRN
  Administered 2024-12-17: 160 mg via INTRAVENOUS

## 2024-12-17 MED ORDER — BUPIVACAINE HCL (PF) 0.5 % IJ SOLN
INTRAMUSCULAR | Status: DC | PRN
  Administered 2024-12-17: 20 mL

## 2024-12-17 MED ORDER — SCOPOLAMINE 1 MG/3DAYS TD PT72
MEDICATED_PATCH | TRANSDERMAL | Status: AC
  Filled 2024-12-17: qty 1

## 2024-12-17 MED ORDER — 0.9 % SODIUM CHLORIDE (POUR BTL) OPTIME
TOPICAL | Status: DC | PRN
  Administered 2024-12-17: 1000 mL

## 2024-12-17 MED ORDER — BUPIVACAINE HCL (PF) 0.5 % IJ SOLN
INTRAMUSCULAR | Status: AC
  Filled 2024-12-17: qty 30

## 2024-12-17 MED ORDER — DEXAMETHASONE SOD PHOSPHATE PF 10 MG/ML IJ SOLN
INTRAMUSCULAR | Status: DC | PRN
  Administered 2024-12-17: 10 mg via INTRAVENOUS

## 2024-12-17 MED ORDER — HYDROMORPHONE HCL 1 MG/ML IJ SOLN
0.2500 mg | INTRAMUSCULAR | Status: DC | PRN
  Administered 2024-12-17 (×4): 0.5 mg via INTRAVENOUS

## 2024-12-17 MED ORDER — OXYCODONE HCL 5 MG PO TABS: 5.0000 mg | ORAL_TABLET | Freq: Four times a day (QID) | ORAL | 0 refills | Status: AC | PRN

## 2024-12-17 MED ORDER — ALUM & MAG HYDROXIDE-SIMETH 200-200-20 MG/5ML PO SUSP
30.0000 mL | Freq: Once | ORAL | Status: AC
  Administered 2024-12-17: 30 mL via ORAL
  Filled 2024-12-17: qty 30

## 2024-12-17 MED ORDER — ROCURONIUM BROMIDE 100 MG/10ML IV SOLN
INTRAVENOUS | Status: DC | PRN
  Administered 2024-12-17: 50 mg via INTRAVENOUS

## 2024-12-17 NOTE — Anesthesia Postprocedure Evaluation (Signed)
"   Anesthesia Post Note  Patient: Rebecca Gould  Procedure(s) Performed: LAPAROSCOPIC CHOLECYSTECTOMY (Abdomen)     Patient location during evaluation: PACU Anesthesia Type: General Level of consciousness: awake and alert and oriented Pain management: pain level controlled Vital Signs Assessment: post-procedure vital signs reviewed and stable Respiratory status: spontaneous breathing, nonlabored ventilation and respiratory function stable Cardiovascular status: blood pressure returned to baseline and stable Postop Assessment: no apparent nausea or vomiting Anesthetic complications: no   No notable events documented.  Last Vitals:  Vitals:   12/17/24 1315 12/17/24 1330  BP: 105/67 104/61  Pulse: 80 (!) 56  Resp: (!) 24 10  Temp:    SpO2: 100% 100%    Last Pain:  Vitals:   12/17/24 1330  TempSrc:   PainSc: 10-Worst pain ever                 [name] A.      "

## 2024-12-17 NOTE — Anesthesia Procedure Notes (Signed)
 Procedure Name: Intubation Date/Time: 12/17/2024 12:15 PM  Performed by: Lendon George, Corean BROCKS, CRNAPre-anesthesia Checklist: Patient identified, Emergency Drugs available, Suction available and Patient being monitored Patient Re-evaluated:Patient Re-evaluated prior to induction Oxygen Delivery Method: Circle system utilized Preoxygenation: Pre-oxygenation with 100% oxygen Induction Type: IV induction Ventilation: Mask ventilation without difficulty Laryngoscope Size: Mac and 3 Grade View: Grade II Tube type: Oral Tube size: 7.0 mm Number of attempts: 1 Airway Equipment and Method: Stylet and Oral airway Placement Confirmation: ETT inserted through vocal cords under direct vision, positive ETCO2 and breath sounds checked- equal and bilateral Secured at: 20 cm Tube secured with: Tape Dental Injury: Teeth and Oropharynx as per pre-operative assessment

## 2024-12-17 NOTE — Discharge Instructions (Signed)
[facility] ______[facility] LAPAROSCOPIC SURGERY: POST OP INSTRUCTIONS Always review your discharge instruction sheet given to you by the facility where your surgery was performed. IF YOU HAVE DISABILITY OR FAMILY LEAVE FORMS, YOU MUST BRING THEM TO THE OFFICE FOR PROCESSING.   DO NOT GIVE THEM TO YOUR DOCTOR.  A prescription for pain medication may be given to you upon discharge.  Take your pain medication as prescribed, if needed.  If narcotic pain medicine is not needed, then you may take acetaminophen (Tylenol) or ibuprofen (Advil) as needed. Take your usually prescribed medications unless otherwise directed. If you need a refill on your pain medication, please contact your pharmacy.  They will contact our office to request authorization. Prescriptions will not be filled after 5pm or on week-ends. You should follow a light diet the first few days after arrival home, such as soup and crackers, etc.  Be sure to include lots of fluids daily. Most patients will experience some swelling and bruising in the area of the incisions.  Ice packs will help.  Swelling and bruising can take several days to resolve.  It is common to experience some constipation if taking pain medication after surgery.  Increasing fluid intake and taking a stool softener (such as Colace) will usually help or prevent this problem from occurring.  A mild laxative (Milk of Magnesia or Miralax) should be taken according to package instructions if there are no bowel movements after 48 hours. Unless discharge instructions indicate otherwise, you may remove your bandages 24-48 hours after surgery, and you may shower at that time.  You may have steri-strips (small skin tapes) in place directly over the incision.  These strips should be left on the skin for 7-10 days.  If your surgeon used skin glue on the incision, you may shower in 24 hours.  The glue will flake off over the next 2-3 weeks.  Any sutures or staples will be  removed at the office during your follow-up visit. ACTIVITIES:  You may resume regular (light) daily activities beginning the next day--such as daily self-care, walking, climbing stairs--gradually increasing activities as tolerated.  You may have sexual intercourse when it is comfortable.  Refrain from any heavy lifting or straining until approved by your doctor. You may drive when you are no longer taking prescription pain medication, you can comfortably wear a seatbelt, and you can safely maneuver your car and apply brakes. RETURN TO WORK:  __________________________________________________________ You should see your doctor in the office for a follow-up appointment approximately 2-3 weeks after your surgery.  Make sure that you call for this appointment within a day or two after you arrive home to insure a convenient appointment time. OTHER INSTRUCTIONS: YOU MAY SHOWER STARTING TOMORROW ICE PACK, TYLENOL, AND IBUPROFEN ALSO FOR PAIN NO LIFTING MORE THAN 15 POUNDS FOR 2 WEEKS __________________________________________________________________________________________________________________________ __________________________________________________________________________________________________________________________ WHEN TO CALL YOUR DOCTOR: Fever over 101.0 Inability to urinate Continued bleeding from incision. Increased pain, redness, or drainage from the incision. Increasing abdominal pain  The clinic staff is available to answer your questions during regular business hours.  Please don't hesitate to call and ask to speak to one of the nurses for clinical concerns.  If you have a medical emergency, go to the nearest emergency room or call 911.  A surgeon from [facility] is always on call at the hospital. [address], [address], [place], [place]  [address] ? [address] [address], [place], [place]   [address] [phone] ? [phone] ?   FAX [phone] Web site:  [email or web address] 

## 2024-12-17 NOTE — Anesthesia Preprocedure Evaluation (Addendum)
 "                                  Anesthesia Evaluation  Patient identified by MRN, date of birth, ID band Patient awake    Reviewed: Allergy & Precautions, NPO status , Patient's Chart, lab work & pertinent test results  Airway Mallampati: II  TM Distance: >3 FB     Dental no notable dental hx. (+) Teeth Intact, Dental Advisory Given   Pulmonary neg pulmonary ROS   Pulmonary exam normal breath sounds clear to auscultation       Cardiovascular negative cardio ROS Normal cardiovascular exam Rhythm:Regular Rate:Normal     Neuro/Psych  PSYCHIATRIC DISORDERS Anxiety Depression    negative neurological ROS     GI/Hepatic Neg liver ROS,,,  Chemistry         Component                Value               Date/Time                 NA                       141                 12/16/2024 2348           NA                       139                 08/26/2018 1620           K                        4.0                 12/16/2024 2348           CL                       106                 12/16/2024 2348           CO2                      24                  12/16/2024 2348           BUN                      15                  12/16/2024 2348           BUN                      12                  08/26/2018 1620           CREATININE               0.62                12/16/2024  2348             Component                Value               Date/Time                 CALCIUM                  9.1                 12/16/2024 2348           ALKPHOS                  100                 12/16/2024 2348           AST                      16                  12/16/2024 2348           ALT                      11                  12/16/2024 2348           BILITOT                  0.5                 12/16/2024 2348           BILITOT                  0.5                 08/26/2018 1620        Acute cholecystitis   Endo/Other  negative endocrine ROS    Renal/GU negative Renal ROS   negative genitourinary   Musculoskeletal  (+) Arthritis ,  Psoriatic arthritis   Abdominal  (+)  Abdomen: tender.   Peds  Hematology Lab Results      Component                Value               Date                      WBC                      8.2                 12/16/2024                HGB                      13.3                12/16/2024                HCT                      38.3  12/16/2024                MCV                      91.6                12/16/2024                PLT                      251                 12/16/2024              Anesthesia Other Findings   Reproductive/Obstetrics                              Anesthesia Physical Anesthesia Plan  ASA: 2 and emergent  Anesthesia Plan: General   Post-op Pain Management: Dilaudid  IV, Ofirmev  IV (intra-op)* and Precedex   Induction: Intravenous  PONV Risk Score and Plan: 4 or greater and Treatment may vary due to age or medical condition, Midazolam, Ondansetron , Dexamethasone  and Propofol  infusion  Airway Management Planned: Oral ETT  Additional Equipment: None  Intra-op Plan:   Post-operative Plan: Extubation in OR  Informed Consent: I have reviewed the patients History and Physical, chart, labs and discussed the procedure including the risks, benefits and alternatives for the proposed anesthesia with the patient or authorized representative who has indicated his/her understanding and acceptance.     Dental advisory given  Plan Discussed with: CRNA and Anesthesiologist  Anesthesia Plan Comments:          Anesthesia Quick Evaluation  "

## 2024-12-17 NOTE — H&P (Signed)
 Rebecca Gould is an 39 y.o. female.   Chief Complaint: Right upper quadrant abdominal pain HPI: This is a 39 year old female who had the sudden onset of right upper quadrant abdominal pain last evening after eating.  The pain radiated through to her back.  It was moderate to severe.  She does not have nausea or vomiting.  She presented to emergency department..  A CT scan was unremarkable but an ultrasound showed a thickened gallbladder wall with sludge and pericholecystic fluid.  There was a 3 mm polyp in the gallbladder wall which was seen on an ultrasound several years ago.  The bile duct was normal.  Liver function tests are normal.  The patient is otherwise healthy without complaints.  She has no cardiopulmonary issues.  Past Medical History:  Diagnosis Date   Anxiety    Depression    Psoriatic arthritis (HCC)     History reviewed. No pertinent surgical history.  History reviewed. No pertinent family history. Social History:  reports that she has never smoked. She has never used smokeless tobacco. She reports current alcohol use. She reports that she does not use drugs.  Allergies: Allergies[1]  (Not in a hospital admission)   Results for orders placed or performed during the hospital encounter of 12/16/24 (from the past 48 hours)  Lipase, blood     Status: None   Collection Time: 12/16/24 11:48 PM  Result Value Ref Range   Lipase 50 11 - 51 U/L    Comment: Performed at [facility], [address], [place], [place] [address]  Comprehensive metabolic panel     Status: Abnormal   Collection Time: 12/16/24 11:48 PM  Result Value Ref Range   Sodium 141 135 - 145 mmol/L   Potassium 4.0 3.5 - 5.1 mmol/L   Chloride 106 98 - 111 mmol/L   CO2 24 22 - 32 mmol/L   Glucose, Bld 101 (H) 70 - 99 mg/dL    Comment: Glucose reference range applies only to samples taken after fasting for at least 8 hours.   BUN 15 6 - 20 mg/dL   Creatinine, Ser 9.37 0.44 - 1.00 mg/dL    Calcium 9.1 8.9 - 89.6 mg/dL   Total Protein 6.9 6.5 - 8.1 g/dL   Albumin 4.5 3.5 - 5.0 g/dL   AST 16 15 - 41 U/L   ALT 11 0 - 44 U/L   Alkaline Phosphatase 100 38 - 126 U/L   Total Bilirubin 0.5 0.0 - 1.2 mg/dL   GFR, Estimated >39 >39 mL/min    Comment: (NOTE) Calculated using the CKD-EPI Creatinine Equation (2021)    Anion gap 11 5 - 15    Comment: Performed at [facility], [address] [place], [place] [address]  CBC     Status: None   Collection Time: 12/16/24 11:48 PM  Result Value Ref Range   WBC 8.2 4.0 - 10.5 K/uL   RBC 4.18 3.87 - 5.11 MIL/uL   Hemoglobin 13.3 12.0 - 15.0 g/dL   HCT 61.6 63.9 - 53.9 %   MCV 91.6 80.0 - 100.0 fL   MCH 31.8 26.0 - 34.0 pg   MCHC 34.7 30.0 - 36.0 g/dL   RDW 86.8 88.4 - 84.4 %   Platelets 251 150 - 400 K/uL   nRBC 0.0 0.0 - 0.2 %    Comment: Performed at [facility], [address] [place], [place] [address]  Urinalysis, Routine w reflex microscopic -Urine, Clean Catch  Status: Abnormal   Collection Time: 12/17/24 12:59 AM  Result Value Ref Range   Color, Urine YELLOW YELLOW   APPearance CLEAR CLEAR   Specific Gravity, Urine 1.020 1.005 - 1.030   pH 7.0 5.0 - 8.0   Glucose, UA NEGATIVE NEGATIVE mg/dL   Hgb urine dipstick TRACE (A) NEGATIVE   Bilirubin Urine NEGATIVE NEGATIVE   Ketones, ur NEGATIVE NEGATIVE mg/dL   Protein, ur NEGATIVE NEGATIVE mg/dL   Nitrite NEGATIVE NEGATIVE   Leukocytes,Ua NEGATIVE NEGATIVE   RBC / HPF 0-5 0 - 5 RBC/hpf   WBC, UA 0-5 0 - 5 WBC/hpf   Bacteria, UA MANY (A) NONE SEEN   Squamous Epithelial / HPF 0-5 0 - 5 /HPF   Mucus PRESENT     Comment: Performed at [facility], [address], [place], [place] [address]  Pregnancy, urine     Status: None   Collection Time: 12/17/24 12:59 AM  Result Value Ref Range   Preg Test, Ur NEGATIVE NEGATIVE    Comment:        THE SENSITIVITY OF THIS METHODOLOGY IS >20 mIU/mL. Performed at [facility], [address] [place], [place] [address]    US  ABDOMEN LIMITED RUQ (LIVER/GB) Result Date: 12/17/2024 EXAM: Right Upper Quadrant Abdominal Ultrasound 12/17/2024 08:28:00 AM TECHNIQUE: Real-time ultrasonography of the right upper quadrant of the abdomen was performed. COMPARISON: US  Abdomen 10/15/2021. CLINICAL HISTORY: Upper abdominal pain, pericholecystic fluid on CT, complains of postprandial epigastric pain and nausea started last night. Abnormal CT. Positive Murphy's Sign. FINDINGS: LIVER: Normal echogenicity. No intrahepatic biliary ductal dilatation. No evidence of mass. Hepatopetal flow in the portal vein. BILIARY SYSTEM: Gallbladder wall thickness measures 4.0 mm. There is gallbladder sludge and pericholecystic fluid. A 3 mm polyp is identified within the gallbladder along the posterior wall near the neck. Positive sonographic Murphy's sign. The common bile duct measures 3.0 mm, which is within normal limits. No cholelithiasis. These findings are consistent with acute cholecystitis. Clinical correlation and surgical consultation are recommended. OTHER: No right upper quadrant ascites. IMPRESSION: 1. Findings most consistent with acute cholecystitis. 2. Incidental 3 mm gallbladder polyp; no imaging follow-up is recommended in the absence of risk factors (e.g., primary sclerosing cholangitis). Electronically signed by: Waddell Calk MD 12/17/2024 08:39 AM EST RP Workstation: [id number]   CT ABDOMEN PELVIS W CONTRAST Result Date: 12/17/2024 EXAM: CT ABDOMEN AND PELVIS WITH CONTRAST 12/17/2024 02:11:24 AM TECHNIQUE: CT of the abdomen and pelvis was performed with the administration of 80 mL of iohexol  (OMNIPAQUE ) 350 MG/ML injection. Multiplanar reformatted images are provided for review. Automated exposure control, iterative reconstruction, and/or weight-based adjustment of the mA/kV was utilized to reduce the radiation dose to as low as reasonably achievable. COMPARISON:  11/04/2024 CLINICAL HISTORY: Abdominal pain, acute, nonlocalized. FINDINGS: LOWER CHEST: No acute abnormality. LIVER: The liver is unremarkable. GALLBLADDER AND BILE DUCTS: Pericholecystic fluid. No visible gallstones by CT. No biliary ductal dilatation. SPLEEN: No acute abnormality. PANCREAS: No acute abnormality. ADRENAL GLANDS: No acute abnormality. KIDNEYS, URETERS AND BLADDER: No stones in the kidneys or ureters. No hydronephrosis. No perinephric or periureteral stranding. Urinary bladder is unremarkable. GI AND BOWEL: Stomach demonstrates no acute abnormality. There is no bowel obstruction. Normal appendix. PERITONEUM AND RETROPERITONEUM: No ascites. No free air. VASCULATURE: Aorta is normal in caliber. LYMPH NODES: No lymphadenopathy. REPRODUCTIVE ORGANS: No acute abnormality. BONES AND SOFT TISSUES: No acute osseous abnormality. No focal soft tissue abnormality. IMPRESSION: 1. Pericholecystic fluid. No visible gallstones by CT. This could  be further evaluated with right upper quadrant ultrasound. Electronically signed by: Franky Crease MD 12/17/2024 02:16 AM EST RP Workstation: [id number]    Review of Systems  All other systems reviewed and are negative.   Blood pressure 107/73, pulse 84, temperature 98.4 F (36.9 C), temperature source Oral, resp. rate 18, height 5' 1 (1.549 m), weight 54.4 kg, last menstrual period 11/30/2024, SpO2 100%. Physical Exam Constitutional:      General: She is not in acute distress.    Appearance: She is well-developed.  HENT:     Head: Normocephalic and atraumatic.  Eyes:     General: No scleral icterus. Cardiovascular:     Rate and Rhythm: Normal rate and regular rhythm.  Abdominal:     Comments: Abdomen is soft.  There is tenderness with some guarding in the right upper quadrant.  There is no hepatomegaly  Skin:    General: Skin is warm and dry.  Neurological:     General: No focal deficit present.     Mental Status: She is alert.  Psychiatric:         Mood and Affect: Mood normal.        Behavior: Behavior normal.      Assessment/Plan Acute cholecystitis.  I have reviewed her CT scan and previous ultrasound as well as the most recent ultrasound.  I do wonder whether she does have small gallstones or is that the sludge causing the cholecystitis.  I discussed the diagnosis with the patient and her husband.  Remove the gallbladder is recommended.  I discussed proceeding with a laparoscopic cholecystectomy.  I discussed the procedure in detail.    We discussed the risks and benefits of a laparoscopic cholecystectomy and possible cholangiogram including, but not limited to bleeding, infection, injury to surrounding structures such as the intestine or liver, bile duct injury bile leak, retained gallstones, need to convert to an open procedure, prolonged diarrhea, blood clots such as  DVT, common bile duct injury, anesthesia risks, and possible need for additional procedures.  The likelihood of improvement in symptoms and return to the patient's normal status is good. We discussed the typical post-operative recovery course.   We did discuss the potential for discharge from the recovery room versus admission postoperatively  Moderate medical decision making  Vicenta Poli, MD 12/17/2024, 10:22 AM       [1]  Allergies Allergen Reactions   Latex

## 2024-12-17 NOTE — Op Note (Signed)
 Laparoscopic Cholecystectomy Procedure Note  Indications: This patient presents with symptomatic gallbladder disease and will undergo laparoscopic cholecystectomy.  An ultrasound showed an acutely flamed gallbladder with thickened wall but no evidence of gallstones other than a gallbladder polyp  Pre-operative Diagnosis: acute cholecystitis  Post-operative Diagnosis: same  Surgeon: Vicenta Poli   Assistants: none  Anesthesia: General endotracheal anesthesia  ASA Class: 1  Procedure Details  The patient was seen again in the Holding Room. The risks, benefits, complications, treatment options, and expected outcomes were discussed with the patient. The possibilities of reaction to medication, pulmonary aspiration, perforation of viscus, bleeding, recurrent infection, finding a normal gallbladder, the need for additional procedures, failure to diagnose a condition, the possible need to convert to an open procedure, and creating a complication requiring transfusion or operation were discussed with the patient. The likelihood of improving the patient's symptoms with return to their baseline status is good.  The patient and/or family concurred with the proposed plan, giving informed consent. The site of surgery properly noted. The patient was taken to Operating Room, identified as Rebecca Gould and the procedure verified as Laparoscopic Cholecystectomy with Intraoperative Cholangiogram. A Time Out was held and the above information confirmed.  Prior to the induction of general anesthesia, antibiotic prophylaxis was administered. General endotracheal anesthesia was then administered and tolerated well. After the induction, the abdomen was prepped with Chloraprep and draped in sterile fashion. The patient was positioned in the supine position.  Local anesthetic agent was injected into the skin near the umbilicus and an incision made. We dissected down to the abdominal fascia with blunt dissection.   The fascia was incised vertically and we entered the peritoneal cavity bluntly.  A pursestring suture of 0-Vicryl was placed around the fascial opening.  The Hasson cannula was inserted and secured with the stay suture.  Pneumoperitoneum was then created with CO2 and tolerated well without any adverse changes in the patient's vital signs. A 5-mm port was placed in the subxiphoid position.  Two 5-mm ports were placed in the right upper quadrant. All skin incisions were infiltrated with a local anesthetic agent before making the incision and placing the trocars.   We positioned the patient in reverse Trendelenburg, tilted slightly to the patient's left.  The gallbladder was identified and found to be acutely inflamed and distended consistent with acute cholecystitis. The fundus grasped and retracted cephalad.. The infundibulum was grasped and retracted laterally, exposing the peritoneum overlying the triangle of Calot. This was then divided and exposed in a blunt fashion. The cystic duct was clearly identified and bluntly dissected circumferentially. A critical view of the cystic duct and cystic artery was obtained.  The cystic duct was then ligated with clips and divided. The cystic artery was, dissected free, ligated with clips and divided as well.   The gallbladder was dissected from the liver bed in retrograde fashion with the electrocautery. The gallbladder was removed and placed in an Endocatch sac. The liver bed was irrigated and inspected. Hemostasis was achieved with the electrocautery. Copious irrigation was utilized and was repeatedly aspirated until clear.  The gallbladder and Endocatch sac were then removed through the umbilical port site.  The pursestring suture was used to close the umbilical fascia.    We again inspected the right upper quadrant for hemostasis.  Pneumoperitoneum was released as we removed the trocars.  4-0 Monocryl was used to close the skin.   Dermabond was then applied. The  patient was then extubated and brought to  the recovery room in stable condition. Instrument, sponge, and needle counts were correct at closure and at the conclusion of the case.   Findings: Acute cholecystitis   Estimated Blood Loss: Minimal         Drains: none         Specimens: Gallbladder           Complications: None; patient tolerated the procedure well.         Disposition: PACU - hemodynamically stable.         Condition: stable

## 2024-12-17 NOTE — ED Provider Notes (Signed)
 Past Medical History:  Diagnosis Date   Anxiety    Depression    Psoriatic arthritis (HCC)     Physical Exam  BP 107/73 (BP Location: Right Arm)   Pulse 84   Temp 98.4 F (36.9 C) (Oral)   Resp 18   LMP 11/30/2024   SpO2 100%   Physical Exam  Procedures  Procedures  ED Course / MDM    Medical Decision Making Amount and/or Complexity of Data Reviewed Labs: ordered. Radiology: ordered.  Risk OTC drugs. Prescription drug management.   Received care of patient at 7 AM from Dr. Haze.  Please see his note for prior history, physical and care.  Briefly this is a 39 year old female presents with concern for epigastric abdominal pain.  At this time she is waiting for a right upper quadrant ultrasound.  She did have a CT that showed no evidence of gallstones but did show Perry cholecystic fluid.  RUQ US  is consistent with cholecystitis with sludge, thickening and Perry cholecystic fluid.  Discussed with Dr. Sebastian who reports that they are unable to do surgery at [facility] today and he spoke with Dr. Vernetta of surgery who has accepted her for emergency to emergency department transfer for surgical evaluation and to be taken to the OR.  I have ordered Zosyn .  She wishes to go POV.  Discussed that the Zosyn  can be initiated once she is at [facility].         Dreama Longs, MD 12/17/24 [phone]

## 2024-12-17 NOTE — Transfer of Care (Signed)
 Immediate Anesthesia Transfer of Care Note  Patient: Rebecca Gould  Procedure(s) Performed: LAPAROSCOPIC CHOLECYSTECTOMY (Abdomen)  Patient Location: PACU  Anesthesia Type:General  Level of Consciousness: awake, alert , and oriented  Airway & Oxygen Therapy: Patient Spontanous Breathing and Patient connected to face mask oxygen  Post-op Assessment: Report given to RN and Post -op Vital signs reviewed and stable  Post vital signs: Reviewed and stable  Last Vitals:  Vitals Value Taken Time  BP 138/89 12/17/24 12:56  Temp    Pulse 89 12/17/24 12:57  Resp 15 12/17/24 12:57  SpO2 96 % 12/17/24 12:57  Vitals shown include unfiled device data.  Last Pain:  Vitals:   12/17/24 1045  TempSrc: Oral  PainSc: 4          Complications: No notable events documented.

## 2024-12-17 NOTE — ED Provider Notes (Signed)
 " [place] EMERGENCY DEPARTMENT AT [facility] Provider Note   CSN: [id number] Arrival date & time: 12/16/24  2341     Patient presents with: Abdominal Pain   Rebecca Gould is a 39 y.o. female.   Patient presents to the emergency department for evaluation of upper abdominal pain.  Patient reports that pain started approximately 30 minutes after eating dinner.  Patient does report that pain radiates into her back.  She took an omeprazole before coming to the ED, no improvement.       Prior to Admission medications  Medication Sig Start Date End Date Taking? Authorizing Provider  ADDERALL XR 5 MG 24 hr capsule Take 5 mg by mouth daily. 12/24/21   [provider]  ALPRAZolam  (XANAX ) 0.5 MG tablet TAKE 1 TABLET BY MOUTH AT BEDTIME AS NEEDED FOR ANXIETY 12/06/18   Stallings, Zoe A, MD  buPROPion (WELLBUTRIN XL) 300 MG 24 hr tablet Take 300 mg by mouth daily.    [provider]  busPIRone (BUSPAR) 10 MG tablet Take 10 mg by mouth 3 (three) times daily.    [provider]  busPIRone (BUSPAR) 30 MG tablet Take 30 mg by mouth 2 (two) times daily. 07/08/21   [provider]  doxycycline  (VIBRAMYCIN ) 100 MG capsule Take 1 capsule (100 mg total) by mouth 2 (two) times daily. 07/23/21   Sabastion Hrdlicka Savannah, PA-C  lamoTRIgine (LAMICTAL) 25 MG tablet Take by mouth.    [provider]  naproxen  (NAPROSYN ) 500 MG tablet Take 1 tablet (500 mg total) by mouth 2 (two) times daily with a meal. 07/23/21   Destani Wamser Savannah, PA-C  traMADol  (ULTRAM ) 50 MG tablet Take 1 tablet (50 mg total) by mouth every 6 (six) hours as needed. 10/15/21   Geroldine Berg, MD    Allergies: Latex    Review of Systems  Updated Vital Signs BP 125/82   Pulse 79   Temp 98 F (36.7 C) (Oral)   Resp 16   LMP 11/30/2024   SpO2 100%   Physical Exam Vitals and nursing note reviewed.  Constitutional:      General: She is not in acute distress.    Appearance: She is well-developed.   HENT:     Head: Normocephalic and atraumatic.     Mouth/Throat:     Mouth: Mucous membranes are moist.  Eyes:     General: Vision grossly intact. Gaze aligned appropriately.     Extraocular Movements: Extraocular movements intact.     Conjunctiva/sclera: Conjunctivae normal.  Cardiovascular:     Rate and Rhythm: Normal rate and regular rhythm.     Pulses: Normal pulses.     Heart sounds: Normal heart sounds, S1 normal and S2 normal. No murmur heard.    No friction rub. No gallop.  Pulmonary:     Effort: Pulmonary effort is normal. No respiratory distress.     Breath sounds: Normal breath sounds.  Abdominal:     General: Bowel sounds are normal.     Palpations: Abdomen is soft.     Tenderness: There is abdominal tenderness in the right upper quadrant. There is no guarding or rebound.     Hernia: No hernia is present.  Musculoskeletal:        General: No swelling.     Cervical back: Full passive range of motion without pain, normal range of motion and neck supple. No spinous process tenderness or muscular tenderness. Normal range of motion.     Right lower leg: No  edema.     Left lower leg: No edema.  Skin:    General: Skin is warm and dry.     Capillary Refill: Capillary refill takes less than 2 seconds.     Findings: No ecchymosis, erythema, rash or wound.  Neurological:     General: No focal deficit present.     Mental Status: She is alert and oriented to person, place, and time.     GCS: GCS eye subscore is 4. GCS verbal subscore is 5. GCS motor subscore is 6.     Cranial Nerves: Cranial nerves 2-12 are intact.     Sensory: Sensation is intact.     Motor: Motor function is intact.     Coordination: Coordination is intact.  Psychiatric:        Attention and Perception: Attention normal.        Mood and Affect: Mood normal.        Speech: Speech normal.        Behavior: Behavior normal.     (all labs ordered are listed, but only abnormal results are displayed) Labs  Reviewed  COMPREHENSIVE METABOLIC PANEL WITH GFR - Abnormal; Notable for the following components:      Result Value   Glucose, Bld 101 (*)    All other components within normal limits  URINALYSIS, ROUTINE W REFLEX MICROSCOPIC - Abnormal; Notable for the following components:   Hgb urine dipstick TRACE (*)    Bacteria, UA MANY (*)    All other components within normal limits  LIPASE, BLOOD  CBC  PREGNANCY, URINE    EKG: None  Radiology: CT ABDOMEN PELVIS W CONTRAST Result Date: 12/17/2024 EXAM: CT ABDOMEN AND PELVIS WITH CONTRAST 12/17/2024 02:11:24 AM TECHNIQUE: CT of the abdomen and pelvis was performed with the administration of 80 mL of iohexol  (OMNIPAQUE ) 350 MG/ML injection. Multiplanar reformatted images are provided for review. Automated exposure control, iterative reconstruction, and/or weight-based adjustment of the mA/kV was utilized to reduce the radiation dose to as low as reasonably achievable. COMPARISON: 11/04/2024 CLINICAL HISTORY: Abdominal pain, acute, nonlocalized. FINDINGS: LOWER CHEST: No acute abnormality. LIVER: The liver is unremarkable. GALLBLADDER AND BILE DUCTS: Pericholecystic fluid. No visible gallstones by CT. No biliary ductal dilatation. SPLEEN: No acute abnormality. PANCREAS: No acute abnormality. ADRENAL GLANDS: No acute abnormality. KIDNEYS, URETERS AND BLADDER: No stones in the kidneys or ureters. No hydronephrosis. No perinephric or periureteral stranding. Urinary bladder is unremarkable. GI AND BOWEL: Stomach demonstrates no acute abnormality. There is no bowel obstruction. Normal appendix. PERITONEUM AND RETROPERITONEUM: No ascites. No free air. VASCULATURE: Aorta is normal in caliber. LYMPH NODES: No lymphadenopathy. REPRODUCTIVE ORGANS: No acute abnormality. BONES AND SOFT TISSUES: No acute osseous abnormality. No focal soft tissue abnormality. IMPRESSION: 1. Pericholecystic fluid. No visible gallstones by CT. This could be further evaluated with right  upper quadrant ultrasound. Electronically signed by: Franky Crease MD 12/17/2024 02:16 AM EST RP Workstation: [id number]     Procedures   Medications Ordered in the ED  ondansetron  (ZOFRAN ) injection 4 mg (4 mg Intravenous Given 12/17/24 0021)  HYDROmorphone  (DILAUDID ) injection 1 mg (1 mg Intravenous Given 12/17/24 0021)  sodium chloride  0.9 % bolus 1,000 mL (0 mLs Intravenous Stopped 12/17/24 0056)  alum & mag hydroxide-simeth (MAALOX/MYLANTA) 200-200-20 MG/5ML suspension 30 mL (30 mLs Oral Given 12/17/24 0155)  famotidine  (PEPCID ) tablet 40 mg (40 mg Oral Given 12/17/24 0156)  iohexol  (OMNIPAQUE ) 350 MG/ML injection 80 mL (80 mLs Intravenous Contrast Given 12/17/24 0211)  Medical Decision Making Amount and/or Complexity of Data Reviewed External Data Reviewed: radiology and notes. Labs: ordered. Decision-making details documented in ED Course. Radiology: ordered and independent interpretation performed. Decision-making details documented in ED Course.  Risk OTC drugs. Prescription drug management.   Differential Diagnosis considered includes, but not limited to: Cholelithiasis; cholecystitis; cholangitis; bowel obstruction; esophagitis; gastritis; peptic ulcer disease; pancreatitis; cardiac.  Presents to the emergency department for evaluation of abdominal pain.  Patient experiencing diffuse upper abdominal pain, initially central epigastric.  Examination reveals tenderness in the right upper quadrant and epigastric area, no Murphy sign.  Patient reports she had similar pain when she was pregnant.  Record review reveals that she did have ultrasound at that time which showed possible polyp but no gallstones or sludge.  This was in 2022.  This is reassuring.  Patient's lab work is normal.  No leukocytosis, normal LFTs, normal lipase.  Again reassuring.  Ultrasound not available at this time, CT scan performed to further evaluate for general abdominal  pain.  CT scan shows no evidence of gallstone but there may be some Perry cholecystic fluid.  Discussed with patient, needs further workup tonight.  No ultrasound available at this time at this site.  Discussed the possibility of transfer to [facility] for ultrasound.  She does not want to be transferred at this time, she reports that this would be another 30 minutes away from her home.  Discussed the possibility of going home and coming back to the ED this morning versus staying overnight.  She reports that she is afraid that the pain will come back, would like to stay in the ED and have the ultrasound performed at 8 AM when ultrasound tech comes in.  This will be performed.  Patient will be signed out to oncoming ER physician to follow-up on ultrasound report.  As needed analgesia.     Final diagnoses:  Right upper quadrant abdominal pain    ED Discharge Orders     None          Orman Matsumura, Lonni PARAS, MD 12/17/24 0246  "

## 2024-12-18 ENCOUNTER — Encounter (HOSPITAL_COMMUNITY): Payer: Self-pay | Admitting: Surgery

## 2024-12-19 LAB — SURGICAL PATHOLOGY

## 2024-12-19 NOTE — Discharge Summary (Signed)
 Physician Discharge Summary  Patient ID: Rebecca Gould MRN: [id number] DOB/AGE: Apr 19, 1986 39 y.o.  Admit date: 12/17/2024 Discharge date: 12/17/24  Admission Diagnoses:  Discharge Diagnoses:  Principal Problem:   Cholecystitis   Discharged Condition: good  Hospital Course: discharged from PACU the day of surgery  Consults:   Significant Diagnostic Studies:   Treatments: surgery: lap chole  Discharge Exam: Blood pressure 110/65, pulse (!) 110, temperature (!) 97.1 F (36.2 C), resp. rate 14, height 5' 1 (1.549 m), weight 54.4 kg, last menstrual period 11/30/2024, SpO2 97%. General appearance: alert, cooperative, and no distress Resp: clear to auscultation bilaterally Cardio: regular rate and rhythm, S1, S2 normal, no murmur, click, rub or gallop Incision/Wound: clean  Disposition: Discharge disposition: 01-Home or Self Care       Discharge Instructions     Increase activity slowly   Complete by: As directed         Follow-up Information     Vernetta Berg, MD. Schedule an appointment as soon as possible for a visit in 3 week(s).   Specialty: General Surgery Contact information: [address] [place] [address] [phone]                 Signed: Berg Vernetta 12/19/2024, 11:27 AM
